# Patient Record
Sex: Male | Born: 1955 | Race: White | Hispanic: No | Marital: Single | Smoking: Former smoker
Health system: Southern US, Community
[De-identification: ages and names within clinical notes are randomized; demographics above are authoritative.]

## PROBLEM LIST (undated history)

## (undated) DIAGNOSIS — K5792 Diverticulitis of intestine, part unspecified, without perforation or abscess without bleeding: Secondary | ICD-10-CM

## (undated) DIAGNOSIS — D6800 Von Willebrand disease, unspecified: Secondary | ICD-10-CM

## (undated) DIAGNOSIS — C801 Malignant (primary) neoplasm, unspecified: Secondary | ICD-10-CM

## (undated) DIAGNOSIS — R011 Cardiac murmur, unspecified: Secondary | ICD-10-CM

## (undated) HISTORY — PX: EYE SURGERY: SHX253

## (undated) HISTORY — PX: NASAL FRACTURE SURGERY: SHX718

---

## 2012-12-17 ENCOUNTER — Inpatient Hospital Stay (HOSPITAL_COMMUNITY)
Admission: EM | Admit: 2012-12-17 | Discharge: 2012-12-20 | DRG: 416 | Disposition: A | Discharge status: Home / Self Care | Payer: BC Managed Care – PPO | Attending: Family Medicine | Admitting: Family Medicine

## 2012-12-17 ENCOUNTER — Emergency Department (HOSPITAL_COMMUNITY): Payer: BC Managed Care – PPO

## 2012-12-17 ENCOUNTER — Encounter (HOSPITAL_COMMUNITY): Payer: Self-pay | Admitting: *Deleted

## 2012-12-17 DIAGNOSIS — D72829 Elevated white blood cell count, unspecified: Secondary | ICD-10-CM

## 2012-12-17 DIAGNOSIS — N390 Urinary tract infection, site not specified: Secondary | ICD-10-CM | POA: Diagnosis present

## 2012-12-17 DIAGNOSIS — N50812 Left testicular pain: Secondary | ICD-10-CM | POA: Diagnosis present

## 2012-12-17 DIAGNOSIS — N400 Enlarged prostate without lower urinary tract symptoms: Secondary | ICD-10-CM | POA: Diagnosis present

## 2012-12-17 DIAGNOSIS — F1010 Alcohol abuse, uncomplicated: Secondary | ICD-10-CM | POA: Diagnosis present

## 2012-12-17 DIAGNOSIS — I959 Hypotension, unspecified: Secondary | ICD-10-CM

## 2012-12-17 DIAGNOSIS — Z833 Family history of diabetes mellitus: Secondary | ICD-10-CM

## 2012-12-17 DIAGNOSIS — R031 Nonspecific low blood-pressure reading: Secondary | ICD-10-CM | POA: Diagnosis present

## 2012-12-17 DIAGNOSIS — R509 Fever, unspecified: Secondary | ICD-10-CM

## 2012-12-17 DIAGNOSIS — A419 Sepsis, unspecified organism: Principal | ICD-10-CM

## 2012-12-17 DIAGNOSIS — D6800 Von Willebrand disease, unspecified: Secondary | ICD-10-CM

## 2012-12-17 DIAGNOSIS — Z8589 Personal history of malignant neoplasm of other organs and systems: Secondary | ICD-10-CM

## 2012-12-17 DIAGNOSIS — Z8249 Family history of ischemic heart disease and other diseases of the circulatory system: Secondary | ICD-10-CM

## 2012-12-17 DIAGNOSIS — F17200 Nicotine dependence, unspecified, uncomplicated: Secondary | ICD-10-CM | POA: Diagnosis present

## 2012-12-17 DIAGNOSIS — Z79899 Other long term (current) drug therapy: Secondary | ICD-10-CM

## 2012-12-17 DIAGNOSIS — R339 Retention of urine, unspecified: Secondary | ICD-10-CM | POA: Diagnosis present

## 2012-12-17 DIAGNOSIS — N509 Disorder of male genital organs, unspecified: Secondary | ICD-10-CM | POA: Diagnosis present

## 2012-12-17 DIAGNOSIS — F17210 Nicotine dependence, cigarettes, uncomplicated: Secondary | ICD-10-CM | POA: Diagnosis present

## 2012-12-17 HISTORY — DX: Von Willebrand disease, unspecified: D68.00

## 2012-12-17 HISTORY — DX: Cardiac murmur, unspecified: R01.1

## 2012-12-17 HISTORY — DX: Diverticulitis of intestine, part unspecified, without perforation or abscess without bleeding: K57.92

## 2012-12-17 HISTORY — DX: Malignant (primary) neoplasm, unspecified: C80.1

## 2012-12-17 HISTORY — DX: Von Willebrand's disease: D68.0

## 2012-12-17 LAB — CBC WITH DIFFERENTIAL/PLATELET
Basophils Relative: 0 % (ref 0–1)
Eosinophils Absolute: 0 10*3/uL (ref 0.0–0.7)
HCT: 43.3 % (ref 39.0–52.0)
Hemoglobin: 14.6 g/dL (ref 13.0–17.0)
Lymphs Abs: 1.2 10*3/uL (ref 0.7–4.0)
MCH: 33.5 pg (ref 26.0–34.0)
MCHC: 33.7 g/dL (ref 30.0–36.0)
Monocytes Absolute: 1.5 10*3/uL — ABNORMAL HIGH (ref 0.1–1.0)
Monocytes Relative: 8 % (ref 3–12)
Neutro Abs: 15.9 10*3/uL — ABNORMAL HIGH (ref 1.7–7.7)
RBC: 4.36 MIL/uL (ref 4.22–5.81)

## 2012-12-17 LAB — URINALYSIS, ROUTINE W REFLEX MICROSCOPIC
Bilirubin Urine: NEGATIVE
Glucose, UA: NEGATIVE mg/dL
Specific Gravity, Urine: 1.025 (ref 1.005–1.030)
pH: 5.5 (ref 5.0–8.0)

## 2012-12-17 LAB — BASIC METABOLIC PANEL
BUN: 8 mg/dL (ref 6–23)
CO2: 25 mEq/L (ref 19–32)
GFR calc non Af Amer: >90 mL/min (ref >90)
Glucose, Bld: 122 mg/dL — ABNORMAL HIGH (ref 70–99)
Potassium: 4 mEq/L (ref 3.5–5.1)
Sodium: 134 mEq/L — ABNORMAL LOW (ref 135–145)

## 2012-12-17 MED ORDER — SODIUM CHLORIDE 0.9 % IV SOLN: INTRAVENOUS | Status: DC

## 2012-12-17 MED ORDER — SODIUM CHLORIDE 0.9 % IV BOLUS (SEPSIS)
500.0000 mL | Freq: Once | INTRAVENOUS | Status: AC
  Administered 2012-12-17: 500 mL via INTRAVENOUS

## 2012-12-17 MED ORDER — DEXTROSE 5 % IV SOLN
1.0000 g | Freq: Once | INTRAVENOUS | Status: AC
  Administered 2012-12-17: 1 g via INTRAVENOUS
  Filled 2012-12-17: qty 10

## 2012-12-17 MED ORDER — ACETAMINOPHEN 500 MG PO TABS
1000.0000 mg | ORAL_TABLET | Freq: Once | ORAL | Status: AC
  Administered 2012-12-17: 1000 mg via ORAL
  Filled 2012-12-17: qty 2

## 2012-12-17 NOTE — ED Notes (Signed)
Attempted to call report. Was advised nurse receiving pt will call this nurse back for report. 

## 2012-12-17 NOTE — ED Notes (Signed)
Pt presents to er with c/o chills, dizziness, headache, lower back pain, increase in urination, urination in small amounts at times. Symptoms started yesterday.

## 2012-12-17 NOTE — H&P (Signed)
[facility] History and Physical  Oscar Sheppard  VOZ:[id number]  DOB: 09/14/1955   DOA: 12/17/2012   PCP:   No PCP Per Patient   Chief Complaint:  Fever chills and difficulty urinating since today  HPI: Oscar Sheppard is an 57 y.o. male.   Middle-aged Caucasian gentleman presents with the above symptoms, associated with a growing pain radiating down the left chest. For some time now he's noted a poor stream urinary frequency, hesitancy and urgent continence with her to suddenly become worse today. He notes that he had a previous episode of work sitting when he took some cough medicine. He has a remote history of "kidney infection" at age 70, but has not had his prostate checked since that time.   In the emergency room, he was lightheaded and hypotensive as well as febrile;  he had scrotal ultrasound and plain abdominal CT, which showed no evidence of epididymitis or kidney stones.  Denies dysuria or penile discharge. Smokes 3 cigars a day and has done so for at least 30 years Drinks 24 ounces a day or every evening, but will drink 4-6 cans on his days off per week, when he can afford. Denies alcohol withdrawal symptoms with abstinence  Colonoscopy in 2010 was normal He had extensive bleeding associated with the ENT surgeon; von Willebrand's disease was diagnosed.  Rewiew of Systems:   All systems negative except as marked bold or noted in the HPI;  Constitutional:    malaise, fever and chills. ;  Eyes:   eye pain, redness and discharge. ;  ENMT:   ear pain, hoarseness, nasal congestion, sinus pressure and sore throat. ;  Cardiovascular:    chest pain, palpitations, diaphoresis, dyspnea and peripheral edema.  Respiratory:   cough, hemoptysis, occasional wheezing and stridor. ;  Gastrointestinal:  nausea, vomiting, diarrhea, constipation, abdominal pain, melena, blood in stool, hematemesis, jaundice and rectal bleeding. unusual weight loss..    Musculoskeletal:   back pain and  neck pain.  swelling and trauma.;  Skin: .  pruritus, rash, abrasions, bruising and skin lesion.; ulcerations Neuro:    headache, lightheadedness and neck stiffness.  weakness, altered level of consciousness, altered mental status, extremity weakness, burning feet, involuntary movement, seizure and syncope.  Psych:    anxiety, depression, insomnia, tearfulness, panic attacks, hallucinations, paranoia, suicidal or homicidal ideation    Past Medical History  Diagnosis Date  . Diverticulitis   . Von Willebrand disease   . Heart murmur   . Cancer     Past Surgical History  Procedure Laterality Date  . Eye surgery    . Nasal fracture surgery      Medications:  HOME MEDS: Prior to Admission medications   Medication Sig Start Date End Date Taking? Authorizing Provider  aspirin EC 81 MG tablet Take 81 mg by mouth daily.   Yes Historical Provider, MD  fish oil-omega-3 fatty acids 1000 MG capsule Take 1-2 g by mouth 3 (three) times a week.   Yes Historical Provider, MD  Glucosamine 500 MG CAPS Take 1 capsule by mouth daily.   Yes Historical Provider, MD  neomycin-bacitracin-polymyxin (NEOSPORIN) [phone] ointment Apply 1 application topically daily as needed (for irritation).   Yes Historical Provider, MD  thiamine (VITAMIN B-1) 100 MG tablet Take 100 mg by mouth daily.   Yes Historical Provider, MD  tolnaftate (TING) 1 % cream Apply 1 application topically daily as needed (for irritation).   Yes Historical Provider, MD     Allergies:  Allergies  Allergen  Reactions  . Aspirin     Can take 81 mg dose, unable to take high dose due to medical hx.     Social History:   reports that he has quit smoking. He does not have any smokeless tobacco history on file. He reports that he does not drink alcohol or use illicit drugs.  Family History: Mother has diabetes and coronary disease;  Physical Exam: Filed Vitals:   12/17/12 1641 12/17/12 1955 12/17/12 2144  BP: 132/93 99/67 111/75   Pulse: 125 103 89  Temp: 98.1 F (36.7 C) 101.4 F (38.6 C) 99.4 F (37.4 C)  TempSrc: Oral Oral Oral  Resp: 20 20 18   SpO2: 98% 97% 97%   Blood pressure 111/75, pulse 89, temperature 99.4 F (37.4 C), temperature source Oral, resp. rate 18, SpO2 97.00%.  GEN:  Pleasant middle-aged Caucasian gentleman lying bed in no acute distress;  Marked temporalis muscle wasting; cooperative with exam PSYCH:  alert and oriented x4;  neither anxious nor depressed; affect is appropriate. HEENT: Mucous membranes pink and anicteric; PERRLA; EOM intact; no cervical lymphadenopathy nor thyromegaly or carotid bruit; no JVD; Breasts:: Not examined CHEST WALL: No tenderness CHEST: Normal respiration, clear to auscultation bilaterally HEART: Regular rate and rhythm; no murmurs rubs or gallops BACK: No kyphosis no scoliosis; no CVA tenderness ABDOMEN: , soft non-tender; no masses, no organomegaly, normal abdominal bowel sounds; no pannus; no intertriginous candida. Rectal Exam: Not done EXTREMITIES: ; age-appropriate arthropathy of the hands and knees; but was extensive muscle wasting; no edema; no ulcerations. Genitalia: not examined PULSES: 2+ and symmetric SKIN: Normal hydration no rash or ulceration CNS: Cranial nerves 2-12 grossly intact no focal lateralizing neurologic deficit   Labs on Admission:  Basic Metabolic Panel:  Recent Labs Lab 12/17/12 1837  NA 134*  K 4.0  CL 98  CO2 25  GLUCOSE 122*  BUN 8  CREATININE 0.73  CALCIUM 9.2   Liver Function Tests: No results found for this basename: AST, ALT, ALKPHOS, BILITOT, PROT, ALBUMIN,  in the last 168 hours No results found for this basename: LIPASE, AMYLASE,  in the last 168 hours No results found for this basename: AMMONIA,  in the last 168 hours CBC:  Recent Labs Lab 12/17/12 1837  WBC 18.7*  NEUTROABS 15.9*  HGB 14.6  HCT 43.3  MCV 99.3  PLT 173   Cardiac Enzymes: No results found for this basename: CKTOTAL, CKMB,  CKMBINDEX, TROPONINI,  in the last 168 hours BNP: No components found with this basename: POCBNP,  D-dimer: No components found with this basename: D-DIMER,  CBG: No results found for this basename: GLUCAP,  in the last 168 hours  Radiological Exams on Admission: Ct Abdomen Pelvis Wo Contrast  12/17/2012   *RADIOLOGY REPORT*  Clinical Data: Lower abdominal and pelvic pain.  Dysuria.  Back pain.  Chills.  CT ABDOMEN AND PELVIS WITHOUT CONTRAST  Technique:  Multidetector CT imaging of the abdomen and pelvis was performed following the standard protocol without intravenous contrast.  Comparison: None.  Findings: No evidence of renal calculi or hydronephrosis.  No evidence of ureteral calculi or dilatation.  No bladder calculi identified.  A small fluid attenuation left renal cyst noted. Urinary bladder and prostate are unremarkable in appearance.  The liver, gallbladder, pancreas, spleen, and adrenal glands have a normal appearance on this noncontrast study.  No soft tissue masses are identified.  No evidence of inflammatory process or abnormal fluid collections.  Diverticulosis is seen involving the descending and sigmoid  colon, however there is no evidence of diverticulitis. No evidence of dilated bowel loops or hernia.  IMPRESSION:  1.  No evidence of urolithiasis, hydronephrosis, or other acute findings. 2.  Diverticulosis.  No radiographic evidence of diverticulitis.   Original Report Authenticated By: Myles Rosenthal, M.D.   US Scrotum  12/17/2012   *RADIOLOGY REPORT*  Clinical Data:  Left-sided testicular pain.  SCROTAL ULTRASOUND DOPPLER ULTRASOUND OF THE TESTICLES  Technique: Complete ultrasound examination of the testicles, epididymis, and other scrotal structures was performed.  Color and spectral Doppler ultrasound were also utilized to evaluate blood flow to the testicles.  Comparison:  None  Findings:  Right testis:  4.5 x 2.9 x 4.0 cm.  No evidence of testicular mass or microlithiasis.  Blood  flow is seen within the right testicle on color Doppler ultrasound.  Left testis:  4.3 x 2.2 x 3.4 cm.  No evidence of testicular mass or microlithiasis.  Blood flow is seen within the left testicle on color Doppler ultrasound.  Right epididymis:  Difficult to visualize, but does not appear enlarged.  Left epididymis:  Difficult to visualize, but does not appear enlarged.  Hydrocele:  Moderate hydroceles are present bilaterally.  Varicocele:  A small to moderate right sided varicocele is seen along the epididymis.  A probable small left varicocele also noted.  Pulsed Doppler interrogation of both testes demonstrates low resistance flow bilaterally.  IMPRESSION:  1.  No evidence of testicular mass or torsion. 2.  Moderate bilateral hydroceles. 3.  Small to moderate bilateral varicoceles, right side larger than left.   Original Report Authenticated By: Myles Rosenthal, M.D.   [place] Art/ven Flow Abd Pelv Doppler  12/17/2012   *RADIOLOGY REPORT*  Clinical Data:  Left-sided testicular pain.  SCROTAL ULTRASOUND DOPPLER ULTRASOUND OF THE TESTICLES  Technique: Complete ultrasound examination of the testicles, epididymis, and other scrotal structures was performed.  Color and spectral Doppler ultrasound were also utilized to evaluate blood flow to the testicles.  Comparison:  None  Findings:  Right testis:  4.5 x 2.9 x 4.0 cm.  No evidence of testicular mass or microlithiasis.  Blood flow is seen within the right testicle on color Doppler ultrasound.  Left testis:  4.3 x 2.2 x 3.4 cm.  No evidence of testicular mass or microlithiasis.  Blood flow is seen within the left testicle on color Doppler ultrasound.  Right epididymis:  Difficult to visualize, but does not appear enlarged.  Left epididymis:  Difficult to visualize, but does not appear enlarged.  Hydrocele:  Moderate hydroceles are present bilaterally.  Varicocele:  A small to moderate right sided varicocele is seen along the epididymis.  A probable small left varicocele  also noted.  Pulsed Doppler interrogation of both testes demonstrates low resistance flow bilaterally.  IMPRESSION:  1.  No evidence of testicular mass or torsion. 2.  Moderate bilateral hydroceles. 3.  Small to moderate bilateral varicoceles, right side larger than left.   Original Report Authenticated By: Myles Rosenthal, M.D.     Assessment/Plan  Active Problems:   UTI (urinary tract infection)   BPH (benign prostatic hyperplasia)   Tobacco use disorder  transient hypotension  PLAN: We'll admit this gentleman for treatment of his urinary tract infection, and start Flomax Counsel on nicotine cessation Will likely need referral to urologist  Other plans as per orders.  Code Status: Full code Family Communication: Plans discussed with patient at bedside  Disposition Plan: Likely discharge to home when stable, and infection appears to be coming  under control    Chancey Cullinane Nocturnist [facility] Pager [phone]   12/17/2012, 9:57 PM

## 2012-12-17 NOTE — ED Notes (Signed)
Pt still in radiology at this time.

## 2012-12-17 NOTE — ED Provider Notes (Signed)
History     CSN: [id number]  Arrival date & time 12/17/12  1631   First MD Initiated Contact with Patient 12/17/12 1659      Chief Complaint  Patient presents with  . Urinary Retention  . Testicle Pain     HPI Pt was seen at 1715.  Per pt, c/o gradual onset and persistence of constant subjective fevers/chills, generalized body aches/fatigue, low back pain and urinary frequency for the past 2 days. States this morning he developed left testicular "pain" which radiates up into his lower abd. States the pain worsens with palpation and when he urinates.  States he has experienced these symptoms several years ago and was dx with "a kidney infection."  Denies rash, no penile drainage, no hematuria, no abd pain, no N/V/D, no CP/SOB, no cough, no sore throat.     Past Medical History  Diagnosis Date  . Diverticulitis   . Von Willebrand disease   . Heart murmur   . Cancer     Past Surgical History  Procedure Laterality Date  . Eye surgery    . Nasal fracture surgery      History  Substance Use Topics  . Smoking status: Former Games developer  . Smokeless tobacco: Not on file  . Alcohol Use: No      Review of Systems ROS: Statement: All systems negative except as marked or noted in the HPI; Constitutional: +chills, generalized body aches/fatigue. ; ; Eyes: Negative for eye pain, redness and discharge. ; ; ENMT: Negative for ear pain, hoarseness, nasal congestion, sinus pressure and sore throat. ; ; Cardiovascular: Negative for chest pain, palpitations, diaphoresis, dyspnea and peripheral edema. ; ; Respiratory: Negative for cough, wheezing and stridor. ; ; Gastrointestinal: Negative for nausea, vomiting, diarrhea, abdominal pain, blood in stool, hematemesis, jaundice and rectal bleeding. . ; ; Genitourinary: +urinary frequency. Negative for dysuria, flank pain and hematuria. ; ; Genital:  No penile drainage or rash, +left testicular pain, no swelling, no perineal pain, no scrotal rash or  swelling.;; Musculoskeletal: +LBP. Negative for neck pain. Negative for swelling and trauma.; ; Skin: Negative for pruritus, rash, abrasions, blisters, bruising and skin lesion.; ; Neuro: Negative for headache, lightheadedness and neck stiffness. Negative for weakness, altered level of consciousness , altered mental status, extremity weakness, paresthesias, involuntary movement, seizure and syncope.      Allergies  Aspirin  Home Medications   Current Outpatient Rx  Name  Route  Sig  Dispense  Refill  . aspirin EC 81 MG tablet   Oral   Take 81 mg by mouth daily.         . fish oil-omega-3 fatty acids 1000 MG capsule   Oral   Take 1-2 g by mouth 3 (three) times a week.         . Glucosamine 500 MG CAPS   Oral   Take 1 capsule by mouth daily.         Marland Kitchen neomycin-bacitracin-polymyxin (NEOSPORIN) [phone] ointment   Topical   Apply 1 application topically daily as needed (for irritation).         . thiamine (VITAMIN B-1) 100 MG tablet   Oral   Take 100 mg by mouth daily.         Marland Kitchen tolnaftate (TING) 1 % cream   Topical   Apply 1 application topically daily as needed (for irritation).           BP 132/93  Pulse 125  Temp(Src) 98.1 F (36.7 C) (Oral)  Resp 20  SpO2 98%  Physical Exam 1720: Physical examination:  Nursing notes reviewed; Vital signs and O2 SAT reviewed;  Constitutional: Well developed, Well nourished, Well hydrated, In no acute distress; Head:  Normocephalic, atraumatic; Eyes: EOMI, PERRL, No scleral icterus; ENMT: Mouth and pharynx normal, Mucous membranes moist; Neck: Supple, Full range of motion, No lymphadenopathy; Cardiovascular: Regular rate and rhythm, No murmur, rub, or gallop; Respiratory: Breath sounds clear & equal bilaterally, No rales, rhonchi, wheezes.  Speaking full sentences with ease, Normal respiratory effort/excursion; Chest: Nontender, Movement normal; Abdomen: Soft, Nontender, Nondistended, Normal bowel sounds; Spine:  No midline  CS, TS, LS tenderness. +TTP bilat lumbar paraspinal muscles;; Genitourinary: No CVA tenderness; Genital performed with pt permission and ED RN chaperone present during exam.  No perineal erythema, ecchymosis, soft tissue crepitus or pain.  No penile lesions or drainage.  No scrotal erythema, edema or tenderness to palp.  Normal testicular lie.  +mild left posterior-lateral testicular tenderness to palp.  +cremasteric reflexes bilat.  No inguinal LAN or palpable masses.;; Extremities: Pulses normal, No tenderness, No edema, No calf edema or asymmetry.; Neuro: AA&Ox3, Major CN grossly intact.  Speech clear. Climbs on and off stretcher easily by himself. Gait steady. No gross focal motor or sensory deficits in extremities.; Skin: Color normal, Warm, Dry.   ED Course  Procedures      MDM  MDM Reviewed: nursing note and vitals Interpretation: labs and CT scan   Results for orders placed during the hospital encounter of 12/17/12  URINALYSIS, ROUTINE W REFLEX MICROSCOPIC      Result Value Range   Color, Urine YELLOW  YELLOW   APPearance CLOUDY (*) CLEAR   Specific Gravity, Urine 1.025  1.005 - 1.030   pH 5.5  5.0 - 8.0   Glucose, UA NEGATIVE  NEGATIVE mg/dL   Hgb urine dipstick LARGE (*) NEGATIVE   Bilirubin Urine NEGATIVE  NEGATIVE   Ketones, ur NEGATIVE  NEGATIVE mg/dL   Protein, ur 30 (*) NEGATIVE mg/dL   Urobilinogen, UA 0.2  0.0 - 1.0 mg/dL   Nitrite POSITIVE (*) NEGATIVE   Leukocytes, UA MODERATE (*) NEGATIVE  URINE MICROSCOPIC-ADD ON      Result Value Range   WBC, UA 21-50  <3 WBC/hpf   RBC / HPF TOO NUMEROUS TO COUNT  <3 RBC/hpf   Bacteria, UA FEW (*) RARE  CBC WITH DIFFERENTIAL      Result Value Range   WBC 18.7 (*) 4.0 - 10.5 K/uL   RBC 4.36  4.22 - 5.81 MIL/uL   Hemoglobin 14.6  13.0 - 17.0 g/dL   HCT 16.1  09.6 - 04.5 %   MCV 99.3  78.0 - 100.0 fL   MCH 33.5  26.0 - 34.0 pg   MCHC 33.7  30.0 - 36.0 g/dL   RDW 40.9  81.1 - 91.4 %   Platelets 173  150 - 400 K/uL    Neutrophils Relative % 85 (*) 43 - 77 %   Neutro Abs 15.9 (*) 1.7 - 7.7 K/uL   Lymphocytes Relative 6 (*) 12 - 46 %   Lymphs Abs 1.2  0.7 - 4.0 K/uL   Monocytes Relative 8  3 - 12 %   Monocytes Absolute 1.5 (*) 0.1 - 1.0 K/uL   Eosinophils Relative 0  0 - 5 %   Eosinophils Absolute 0.0  0.0 - 0.7 K/uL   Basophils Relative 0  0 - 1 %   Basophils Absolute 0.1  0.0 - 0.1 K/uL  LACTIC ACID, PLASMA      Result Value Range   Lactic Acid, Venous 0.7  0.5 - 2.2 mmol/L  BASIC METABOLIC PANEL      Result Value Range   Sodium 134 (*) 135 - 145 mEq/L   Potassium 4.0  3.5 - 5.1 mEq/L   Chloride 98  96 - 112 mEq/L   CO2 25  19 - 32 mEq/L   Glucose, Bld 122 (*) 70 - 99 mg/dL   BUN 8  6 - 23 mg/dL   Creatinine, Ser 9.60  0.50 - 1.35 mg/dL   Calcium 9.2  8.4 - 45.4 mg/dL   GFR calc non Af Amer >90  >90 mL/min   GFR calc Af Amer >90  >90 mL/min   Ct Abdomen Pelvis Wo Contrast 12/17/2012   *RADIOLOGY REPORT*  Clinical Data: Lower abdominal and pelvic pain.  Dysuria.  Back pain.  Chills.  CT ABDOMEN AND PELVIS WITHOUT CONTRAST  Technique:  Multidetector CT imaging of the abdomen and pelvis was performed following the standard protocol without intravenous contrast.  Comparison: None.  Findings: No evidence of renal calculi or hydronephrosis.  No evidence of ureteral calculi or dilatation.  No bladder calculi identified.  A small fluid attenuation left renal cyst noted. Urinary bladder and prostate are unremarkable in appearance.  The liver, gallbladder, pancreas, spleen, and adrenal glands have a normal appearance on this noncontrast study.  No soft tissue masses are identified.  No evidence of inflammatory process or abnormal fluid collections.  Diverticulosis is seen involving the descending and sigmoid colon, however there is no evidence of diverticulitis. No evidence of dilated bowel loops or hernia.  IMPRESSION:  1.  No evidence of urolithiasis, hydronephrosis, or other acute findings. 2.  Diverticulosis.   No radiographic evidence of diverticulitis.   Original Report Authenticated By: Myles Rosenthal, M.D.   [place] Art/ven Flow Abd Pelv Doppler 12/17/2012   *RADIOLOGY REPORT*  Clinical Data:  Left-sided testicular pain.  SCROTAL ULTRASOUND DOPPLER ULTRASOUND OF THE TESTICLES  Technique: Complete ultrasound examination of the testicles, epididymis, and other scrotal structures was performed.  Color and spectral Doppler ultrasound were also utilized to evaluate blood flow to the testicles.  Comparison:  None  Findings:  Right testis:  4.5 x 2.9 x 4.0 cm.  No evidence of testicular mass or microlithiasis.  Blood flow is seen within the right testicle on color Doppler ultrasound.  Left testis:  4.3 x 2.2 x 3.4 cm.  No evidence of testicular mass or microlithiasis.  Blood flow is seen within the left testicle on color Doppler ultrasound.  Right epididymis:  Difficult to visualize, but does not appear enlarged.  Left epididymis:  Difficult to visualize, but does not appear enlarged.  Hydrocele:  Moderate hydroceles are present bilaterally.  Varicocele:  A small to moderate right sided varicocele is seen along the epididymis.  A probable small left varicocele also noted.  Pulsed Doppler interrogation of both testes demonstrates low resistance flow bilaterally.  IMPRESSION:  1.  No evidence of testicular mass or torsion. 2.  Moderate bilateral hydroceles. 3.  Small to moderate bilateral varicoceles, right side larger than left.   Original Report Authenticated By: Myles Rosenthal, M.D.     2115:  APAP given for fever.  SBP dropped to 90's, will give IVF bolus. +UTI, UC pending; will order IV rocephin. Dx and testing d/w pt.  Questions answered.  Verb understanding, agreeable to observation admit.  T/C to [facility]. Orvan Falconer, case discussed, including:  HPI, pertinent PM/SHx, VS/PE, dx testing, ED course and treatment:  Agreeable to observation admit, requests to write temporary orders, obtain inpatient tele bed to team  1.         Laray Anger, DO 12/20/12 Rickey Primus

## 2012-12-18 ENCOUNTER — Encounter (HOSPITAL_COMMUNITY): Payer: Self-pay | Admitting: Internal Medicine

## 2012-12-18 DIAGNOSIS — D680 Von Willebrand's disease: Secondary | ICD-10-CM

## 2012-12-18 DIAGNOSIS — A419 Sepsis, unspecified organism: Secondary | ICD-10-CM

## 2012-12-18 DIAGNOSIS — N390 Urinary tract infection, site not specified: Secondary | ICD-10-CM | POA: Diagnosis present

## 2012-12-18 DIAGNOSIS — N509 Disorder of male genital organs, unspecified: Secondary | ICD-10-CM

## 2012-12-18 DIAGNOSIS — F17200 Nicotine dependence, unspecified, uncomplicated: Secondary | ICD-10-CM | POA: Diagnosis present

## 2012-12-18 DIAGNOSIS — N400 Enlarged prostate without lower urinary tract symptoms: Secondary | ICD-10-CM | POA: Diagnosis present

## 2012-12-18 DIAGNOSIS — F17210 Nicotine dependence, cigarettes, uncomplicated: Secondary | ICD-10-CM | POA: Diagnosis present

## 2012-12-18 DIAGNOSIS — N50812 Left testicular pain: Secondary | ICD-10-CM | POA: Diagnosis present

## 2012-12-18 DIAGNOSIS — F1010 Alcohol abuse, uncomplicated: Secondary | ICD-10-CM | POA: Diagnosis present

## 2012-12-18 LAB — BASIC METABOLIC PANEL
Calcium: 8.8 mg/dL (ref 8.4–10.5)
GFR calc Af Amer: >90 mL/min (ref >90)
GFR calc non Af Amer: >90 mL/min (ref >90)
Glucose, Bld: 115 mg/dL — ABNORMAL HIGH (ref 70–99)
Sodium: 134 mEq/L — ABNORMAL LOW (ref 135–145)

## 2012-12-18 LAB — HEPATIC FUNCTION PANEL
ALT: 14 U/L (ref 0–53)
AST: 15 U/L (ref 0–37)
Bilirubin, Direct: 0.2 mg/dL (ref 0.0–0.3)
Total Bilirubin: 1.2 mg/dL (ref 0.3–1.2)

## 2012-12-18 LAB — CBC
MCH: 33.3 pg (ref 26.0–34.0)
Platelets: 164 10*3/uL (ref 150–400)
RBC: 4.2 MIL/uL — ABNORMAL LOW (ref 4.22–5.81)
WBC: 15 10*3/uL — ABNORMAL HIGH (ref 4.0–10.5)

## 2012-12-18 LAB — PROTIME-INR: Prothrombin Time: 14.8 seconds (ref 11.6–15.2)

## 2012-12-18 LAB — TSH: TSH: 2.412 u[IU]/mL (ref 0.350–4.500)

## 2012-12-18 MED ORDER — LORAZEPAM 2 MG/ML IJ SOLN: 1.0000 mg | Freq: Four times a day (QID) | INTRAMUSCULAR | Status: DC | PRN

## 2012-12-18 MED ORDER — VITAMIN B-1 100 MG PO TABS
100.0000 mg | ORAL_TABLET | Freq: Every day | ORAL | Status: DC
  Administered 2012-12-18 – 2012-12-20 (×3): 100 mg via ORAL
  Filled 2012-12-18 (×3): qty 1

## 2012-12-18 MED ORDER — ONDANSETRON HCL 4 MG PO TABS: 4.0000 mg | ORAL_TABLET | Freq: Four times a day (QID) | ORAL | Status: DC | PRN

## 2012-12-18 MED ORDER — PNEUMOCOCCAL VAC POLYVALENT 25 MCG/0.5ML IJ INJ
0.5000 mL | INJECTION | INTRAMUSCULAR | Status: AC
  Administered 2012-12-19: 0.5 mL via INTRAMUSCULAR
  Filled 2012-12-18: qty 0.5

## 2012-12-18 MED ORDER — OMEGA-3-ACID ETHYL ESTERS 1 G PO CAPS
1.0000 g | ORAL_CAPSULE | Freq: Every day | ORAL | Status: DC
  Administered 2012-12-18 – 2012-12-20 (×3): 1 g via ORAL
  Filled 2012-12-18 (×3): qty 1

## 2012-12-18 MED ORDER — CIPROFLOXACIN IN D5W 400 MG/200ML IV SOLN
400.0000 mg | Freq: Two times a day (BID) | INTRAVENOUS | Status: DC
  Administered 2012-12-18: 400 mg via INTRAVENOUS
  Filled 2012-12-18 (×4): qty 200

## 2012-12-18 MED ORDER — ACETAMINOPHEN 325 MG PO TABS
650.0000 mg | ORAL_TABLET | Freq: Four times a day (QID) | ORAL | Status: DC | PRN
  Administered 2012-12-18 – 2012-12-19 (×2): 650 mg via ORAL
  Filled 2012-12-18 (×2): qty 2

## 2012-12-18 MED ORDER — DOXYCYCLINE HYCLATE 100 MG PO TABS
100.0000 mg | ORAL_TABLET | Freq: Two times a day (BID) | ORAL | Status: DC
  Administered 2012-12-18 – 2012-12-20 (×5): 100 mg via ORAL
  Filled 2012-12-18 (×5): qty 1

## 2012-12-18 MED ORDER — LORAZEPAM 1 MG PO TABS: 1.0000 mg | ORAL_TABLET | Freq: Four times a day (QID) | ORAL | Status: DC | PRN

## 2012-12-18 MED ORDER — BISACODYL 10 MG RE SUPP: 10.0000 mg | Freq: Every day | RECTAL | Status: DC | PRN

## 2012-12-18 MED ORDER — TRAZODONE HCL 50 MG PO TABS: 50.0000 mg | ORAL_TABLET | Freq: Every evening | ORAL | Status: DC | PRN

## 2012-12-18 MED ORDER — NICOTINE 14 MG/24HR TD PT24
14.0000 mg | MEDICATED_PATCH | Freq: Every day | TRANSDERMAL | Status: DC
  Administered 2012-12-18 – 2012-12-20 (×4): 14 mg via TRANSDERMAL
  Filled 2012-12-18 (×3): qty 1

## 2012-12-18 MED ORDER — FLEET ENEMA 7-19 GM/118ML RE ENEM: 1.0000 | ENEMA | Freq: Once | RECTAL | Status: AC | PRN

## 2012-12-18 MED ORDER — POTASSIUM CHLORIDE IN NACL 20-0.9 MEQ/L-% IV SOLN
INTRAVENOUS | Status: DC
  Administered 2012-12-18 (×2): via INTRAVENOUS

## 2012-12-18 MED ORDER — ENOXAPARIN SODIUM 40 MG/0.4ML ~~LOC~~ SOLN
40.0000 mg | SUBCUTANEOUS | Status: DC
  Administered 2012-12-18: 40 mg via SUBCUTANEOUS
  Filled 2012-12-18: qty 0.4

## 2012-12-18 MED ORDER — OMEGA-3 FATTY ACIDS 1000 MG PO CAPS
1.0000 g | ORAL_CAPSULE | ORAL | Status: DC
  Filled 2012-12-18: qty 2

## 2012-12-18 MED ORDER — ONDANSETRON HCL 4 MG/2ML IJ SOLN: 4.0000 mg | INTRAMUSCULAR | Status: DC | PRN

## 2012-12-18 MED ORDER — CIPROFLOXACIN IN D5W 400 MG/200ML IV SOLN
INTRAVENOUS | Status: AC
  Filled 2012-12-18: qty 200

## 2012-12-18 MED ORDER — ADULT MULTIVITAMIN W/MINERALS CH
1.0000 | ORAL_TABLET | Freq: Every day | ORAL | Status: DC
  Administered 2012-12-18 – 2012-12-20 (×3): 1 via ORAL
  Filled 2012-12-18 (×3): qty 1

## 2012-12-18 MED ORDER — ENOXAPARIN SODIUM 40 MG/0.4ML ~~LOC~~ SOLN
40.0000 mg | Freq: Every day | SUBCUTANEOUS | Status: DC
  Administered 2012-12-18 – 2012-12-19 (×2): 40 mg via SUBCUTANEOUS
  Filled 2012-12-18 (×2): qty 0.4

## 2012-12-18 MED ORDER — ASPIRIN EC 81 MG PO TBEC
81.0000 mg | DELAYED_RELEASE_TABLET | Freq: Every day | ORAL | Status: DC
  Administered 2012-12-18 – 2012-12-20 (×3): 81 mg via ORAL
  Filled 2012-12-18 (×3): qty 1

## 2012-12-18 MED ORDER — DEXTROSE 5 % IV SOLN
1.0000 g | INTRAVENOUS | Status: DC
  Administered 2012-12-18 – 2012-12-19 (×2): 1 g via INTRAVENOUS
  Filled 2012-12-18 (×3): qty 10

## 2012-12-18 MED ORDER — FOLIC ACID 1 MG PO TABS
1.0000 mg | ORAL_TABLET | Freq: Every day | ORAL | Status: DC
  Administered 2012-12-18 – 2012-12-20 (×3): 1 mg via ORAL
  Filled 2012-12-18 (×3): qty 1

## 2012-12-18 MED ORDER — TAMSULOSIN HCL 0.4 MG PO CAPS
0.4000 mg | ORAL_CAPSULE | Freq: Every day | ORAL | Status: DC
  Administered 2012-12-18 – 2012-12-19 (×2): 0.4 mg via ORAL
  Filled 2012-12-18 (×3): qty 1

## 2012-12-18 NOTE — Progress Notes (Signed)
[facility] PROGRESS NOTE  Oscar Sheppard ZOX:[id number] DOB: 08/13/1955 DOA: 12/17/2012 PCP: No PCP Per Patient  Assessment/Plan: 1. UTI with possible sepsis on admission: Followup culture. Continue IV fluids and antibiotics. 2. Left testicular pain: No evidence of torsion, mass or acute surgical process. Consider epididymitis although imaging was unremarkable. Elevate scrotum. Add doxycycline. 3. Suspected BPH: Trial of Flomax. Consider outpatient urology evaluation. 4. Alcohol abuse: CIWA. Monitor for withdrawal. 5. History of von Willebrand's disease 6. Cigarette smoker: Nicotine patch offered. Recommend cessation.   Discontinue Cipro. Add doxycycline for 10 days  Discontinue telemetry.  Code Status: Full code DVT prophylaxis: Lovenox Family Communication: None present Disposition Plan: Home when improved  Brendia Sacks, MD  [facility]  Pager [phone] If 7PM-7AM, please contact night-coverage at [email or web address], password [facility] 12/18/2012, 10:38 AM  LOS: 1 day   Clinical Summary: 57 year old man presented with low back pain, fever, chills, urinary hesitation and increased urination and left testicular pain in the emergency department he was noted be febrile and hypotensive. Scrotal ultrasound and abdominal CT were unremarkable. Denied penile discharge.  Consultants:    Procedures:    Antibiotics:  Ceftriaxone 5/31 >>  Ciprofloxacin 6/1 >>  HPI/Subjective: Tmax 101.4. Overall feels somewhat better. No vomiting. Less testicular pain today. Urinating frequently.   Objective: Filed Vitals:   12/17/12 2144 12/17/12 2231 12/18/12 0208 12/18/12 0555  BP: 111/75 126/80 109/66 107/68  Pulse: 89 101 105 109  Temp: 99.4 F (37.4 C) 98.3 F (36.8 C)  99 F (37.2 C)  TempSrc: Oral Oral  Oral  Resp: 18 20 18 20   Height:  6\' 4"  (1.93 m)    Weight:  93.305 kg (205 lb 11.2 oz)  93.622 kg (206 lb 6.4 oz)  SpO2: 97% 100% 98% 97%    Intake/Output Summary (Last  24 hours) at 12/18/12 1038 Last data filed at 12/18/12 0800  Gross per 24 hour  Intake    740 ml  Output    950 ml  Net   -210 ml     Filed Weights   12/17/12 2231 12/18/12 0555  Weight: 93.305 kg (205 lb 11.2 oz) 93.622 kg (206 lb 6.4 oz)    Exam:  General:  Appears calm and comfortable. Nontoxic. Cardiovascular: RRR, no m/r/g. No LE edema. Telemetry: SR, no arrhythmias  Respiratory: CTA bilaterally, no w/r/r. Normal respiratory effort. Abdomen: soft, ntnd GU: Penis uncircumcised, appears unremarkable. Scrotal appears normal. Right testicle normal palpation without tenderness. Left testicle tender to palpation, no masses appreciated. Hydrocele palpated. Psychiatric: grossly normal mood and affect, speech fluent and appropriate Neurologic: grossly non-focal.  Data Reviewed:  Urinalysis grossly positive. Complete metabolic panel unremarkable. White blood cell count decreased to 15.0. Ultrasound of scrotum unremarkable except for bilateral hydroceles and varicoceles. CT of the abdomen and pelvis unremarkable. No evidence of urolithiasis, hydronephrosis.  Pending studies:  Urine culture   Scheduled Meds: . aspirin EC  81 mg Oral Daily  . cefTRIAXone (ROCEPHIN)  IV  1 g Intravenous Q24H  . ciprofloxacin  400 mg Intravenous Q12H  . enoxaparin (LOVENOX) injection  40 mg Subcutaneous Q24H  . omega-3 acid ethyl esters  1 g Oral Daily  . tamsulosin  0.4 mg Oral QPC supper  . thiamine  100 mg Oral Daily   Continuous Infusions: . 0.9 % NaCl with KCl 20 mEq / L 150 mL/hr at 12/18/12 1016    Principal Problem:   UTI (urinary tract infection) Active Problems:   BPH (benign prostatic hyperplasia)  Tobacco use disorder   Testicular pain, left   Sepsis   Alcohol abuse   Von Willebrand disease   Cigarette smoker   Time spent 25 minutes

## 2012-12-18 NOTE — Consult Note (Signed)
FAOZ#[id number]

## 2012-12-19 DIAGNOSIS — R339 Retention of urine, unspecified: Secondary | ICD-10-CM | POA: Diagnosis present

## 2012-12-19 LAB — GLUCOSE, CAPILLARY: Glucose-Capillary: 157 mg/dL — ABNORMAL HIGH (ref 70–99)

## 2012-12-19 LAB — CBC
HCT: 40.8 % (ref 39.0–52.0)
Hemoglobin: 13.7 g/dL (ref 13.0–17.0)
RDW: 13.6 % (ref 11.5–15.5)
WBC: 9.8 10*3/uL (ref 4.0–10.5)

## 2012-12-19 MED ORDER — IBUPROFEN 600 MG PO TABS
600.0000 mg | ORAL_TABLET | Freq: Four times a day (QID) | ORAL | Status: DC | PRN
  Administered 2012-12-19: 600 mg via ORAL
  Filled 2012-12-19: qty 1

## 2012-12-19 NOTE — Progress Notes (Signed)
[facility]  Lieutenant Abarca QMV:[id number] DOB: 04-15-1956 DOA: 12/17/2012 PCP: No PCP Per Patient  Patient seen, independently examined and chart reviewed. I agree with exam, assessment and plan discussed with Toya Smothers, NP.  Interval history/Subjective: Feels a little better. Had chills this morning. Urinary retention overnight. Foley catheter was placed with return. Urology was consulted.  Objective: Appears calm and comfortable. Vitals stable. Cardiovascular regular rate and rhythm. Respiratory clear. Abdomen soft. No hernia appreciated.  Labs/studies: CBC unremarkable  Assessment:  UTI with possible sepsis on admission  Left testicular pain, apparently improving  Plan:  Continue antibiotics, followup culture  Brendia Sacks, MD [facility] [phone]

## 2012-12-19 NOTE — Consult Note (Signed)
[name], [name]              ACCOUNT NO.:  [id number]  MEDICAL RECORD NO.:  [id number]  LOCATION:  A324                          FACILITY:  [facility]  PHYSICIAN:  Ky Barban, M.D.DATE OF BIRTH:  [birth year]  DATE OF CONSULTATION: DATE OF DISCHARGE:                                CONSULTATION   CHIEF COMPLAINT:  Fever and chills, difficulty to void.  HISTORY:  A 57 year old gentleman came to the emergency room because he was not feeling well, having chills and fever, having urinary frequency, urgency, urge incontinence, almost no nocturia.  He says urinary stream is slow, has difficulty to empty his bladder, and he was placed on __________ after having a CT scan of the abdomen and pelvis, which was negative.  No urinary stone.  He was admitted for further workup and management.  He was complaining of pain in the left testicle, so testicular ultrasound was done, it looks essentially normal.  He is still having some discomfort, he is still having chills.  Since yesterday, he has been placed on IV Rocephin along with Vibra-Tabs 100 mg daily.  His urine culture is still pending.  His temperature is 99.1. He came with 101.4 temperature.  I have reviewed his old record and all his chart.  Reviewed his x-rays.  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  I just do not understand why he had, but that is how he described it.  PHYSICAL EXAMINATION:  GENERAL:  Moderately built, fully conscious, alert, oriented, not in acute distress. VITAL SIGNS:  His blood pressure is 127/82, temperature is 98.5, pulse 101 per minute, respirations 20 per minute, O2 saturation room air was 99%. ABDOMEN:  Soft, flat.  Liver, spleen, kidneys are not palpable.  No CVA tenderness.  His bladder scan for residual has been done  several times. Nurse told me it was about 300 mL, so we put a Foley catheter.  We got about 400 mL of residual urine. RECTAL:  I purposely deferred it because he is having tenderness on the left side, I do not want to cause any epididymitis.  I will see him again in the morning and see how is he doing and then decide about doing further workup with cystoscopy.  I appreciate Dr. Orvan Falconer for this consultation.     Ky Barban, M.D.    MIJ/MEDQ  D:  12/18/2012  T:  12/18/2012  Job:  [id number]

## 2012-12-19 NOTE — Progress Notes (Signed)
[facility] PROGRESS NOTE  Oscar Sheppard XBJ:[id number] DOB: 1955-08-15 DOA: 12/17/2012 PCP: No PCP Per Patient  Assessment/Plan: 1. UTI with possible sepsis on admission: Culture pending. Max temp 100.5. Hemodynamically stable. Taking po fluids well. Continue  Antibiotics day #3. 2. Left testicular pain: some improvement today. No evidence of torsion, mass or acute surgical process. Consider epididymitis although imaging was unremarkable. Continue to elevate scrotum. Continue doxycycline day #2. 3. Suspected BPH: Appreciate urology assistance. Await recommendations.  Foley intact draining clear yellow urine.  4. Alcohol abuse: CIWA. Monitor for withdrawal. 5. History of von Willebrand's disease 6. Cigarette smoker: Nicotine patch offered. Recommend cessation. 7. [name] retention: etiology unclear. Foley intact draining. Await urology recommendations.    Code Status: full Family Communication: none availabl Disposition Plan: home when ready   Consultants:  Urology  Procedures:  none  Antibiotics:  cipro 5/31/-12/18/12  Doxycycline 12/18/12>>> 12/28/12  rocephine 12/17/12 >>>  HPI/Subjective: Awake alert denies pain/discomfort. Reports not sleeping well  Objective: Filed Vitals:   12/18/12 1506 12/18/12 1830 12/18/12 2300 12/19/12 0500  BP: 124/70 127/82 122/80 108/71  Pulse: 110 101 102 85  Temp: 99.1 F (37.3 C) 98.5 F (36.9 C) 100.5 F (38.1 C) 99.3 F (37.4 C)  TempSrc: Oral Oral Oral Oral  Resp: 20 20 20 14   Height:      Weight:      SpO2: 98% 99% 98% 96%    Intake/Output Summary (Last 24 hours) at 12/19/12 1035 Last data filed at 12/19/12 0932  Gross per 24 hour  Intake    480 ml  Output   2150 ml  Net  -1670 ml   Filed Weights   12/17/12 2231 12/18/12 0555  Weight: 93.305 kg (205 lb 11.2 oz) 93.622 kg (206 lb 6.4 oz)    Exam:   General:  Well nourished NAD  Cardiovascular: RRR no MGR no LE edema  Respiratory: normal effort BS clear  bilaterally no wheeze, no rhonchi  Abdomen: round soft +BS non-tender to palpation  Musculoskeletal: no clubbing no cyanosis  GU: Uncircumcised penis without drainage, foley intact, scrotum without erythemal swelling   Data Reviewed: Basic Metabolic Panel:  Recent Labs Lab 12/17/12 1837 12/18/12 0630  NA 134* 134*  K 4.0 3.8  CL 98 101  CO2 25 24  GLUCOSE 122* 115*  BUN 8 8  CREATININE 0.73 0.62  CALCIUM 9.2 8.8   Liver Function Tests:  Recent Labs Lab 12/18/12 0630  AST 15  ALT 14  ALKPHOS 97  BILITOT 1.2  PROT 6.8  ALBUMIN 3.4*   No results found for this basename: LIPASE, AMYLASE,  in the last 168 hours No results found for this basename: AMMONIA,  in the last 168 hours CBC:  Recent Labs Lab 12/17/12 1837 12/18/12 0630 12/19/12 0506  WBC 18.7* 15.0* 9.8  NEUTROABS 15.9*  --   --   HGB 14.6 14.0 13.7  HCT 43.3 41.8 40.8  MCV 99.3 99.5 98.3  PLT 173 164 168   Cardiac Enzymes: No results found for this basename: CKTOTAL, CKMB, CKMBINDEX, TROPONINI,  in the last 168 hours BNP (last 3 results) No results found for this basename: PROBNP,  in the last 8760 hours CBG: No results found for this basename: GLUCAP,  in the last 168 hours  No results found for this or any previous visit (from the past 240 hour(s)).   Studies: Ct Abdomen Pelvis Wo Contrast  12/17/2012   *RADIOLOGY REPORT*  Clinical Data: Lower abdominal and pelvic pain.  Dysuria.  Back pain.  Chills.  CT ABDOMEN AND PELVIS WITHOUT CONTRAST  Technique:  Multidetector CT imaging of the abdomen and pelvis was performed following the standard protocol without intravenous contrast.  Comparison: None.  Findings: No evidence of renal calculi or hydronephrosis.  No evidence of ureteral calculi or dilatation.  No bladder calculi identified.  A small fluid attenuation left renal cyst noted. Urinary bladder and prostate are unremarkable in appearance.  The liver, gallbladder, pancreas, spleen, and adrenal  glands have a normal appearance on this noncontrast study.  No soft tissue masses are identified.  No evidence of inflammatory process or abnormal fluid collections.  Diverticulosis is seen involving the descending and sigmoid colon, however there is no evidence of diverticulitis. No evidence of dilated bowel loops or hernia.  IMPRESSION:  1.  No evidence of urolithiasis, hydronephrosis, or other acute findings. 2.  Diverticulosis.  No radiographic evidence of diverticulitis.   Original Report Authenticated By: Myles Rosenthal, M.D.   US Scrotum  12/17/2012   *RADIOLOGY REPORT*  Clinical Data:  Left-sided testicular pain.  SCROTAL ULTRASOUND DOPPLER ULTRASOUND OF THE TESTICLES  Technique: Complete ultrasound examination of the testicles, epididymis, and other scrotal structures was performed.  Color and spectral Doppler ultrasound were also utilized to evaluate blood flow to the testicles.  Comparison:  None  Findings:  Right testis:  4.5 x 2.9 x 4.0 cm.  No evidence of testicular mass or microlithiasis.  Blood flow is seen within the right testicle on color Doppler ultrasound.  Left testis:  4.3 x 2.2 x 3.4 cm.  No evidence of testicular mass or microlithiasis.  Blood flow is seen within the left testicle on color Doppler ultrasound.  Right epididymis:  Difficult to visualize, but does not appear enlarged.  Left epididymis:  Difficult to visualize, but does not appear enlarged.  Hydrocele:  Moderate hydroceles are present bilaterally.  Varicocele:  A small to moderate right sided varicocele is seen along the epididymis.  A probable small left varicocele also noted.  Pulsed Doppler interrogation of both testes demonstrates low resistance flow bilaterally.  IMPRESSION:  1.  No evidence of testicular mass or torsion. 2.  Moderate bilateral hydroceles. 3.  Small to moderate bilateral varicoceles, right side larger than left.   Original Report Authenticated By: Myles Rosenthal, M.D.   [place] Art/ven Flow Abd Pelv  Doppler  12/17/2012   *RADIOLOGY REPORT*  Clinical Data:  Left-sided testicular pain.  SCROTAL ULTRASOUND DOPPLER ULTRASOUND OF THE TESTICLES  Technique: Complete ultrasound examination of the testicles, epididymis, and other scrotal structures was performed.  Color and spectral Doppler ultrasound were also utilized to evaluate blood flow to the testicles.  Comparison:  None  Findings:  Right testis:  4.5 x 2.9 x 4.0 cm.  No evidence of testicular mass or microlithiasis.  Blood flow is seen within the right testicle on color Doppler ultrasound.  Left testis:  4.3 x 2.2 x 3.4 cm.  No evidence of testicular mass or microlithiasis.  Blood flow is seen within the left testicle on color Doppler ultrasound.  Right epididymis:  Difficult to visualize, but does not appear enlarged.  Left epididymis:  Difficult to visualize, but does not appear enlarged.  Hydrocele:  Moderate hydroceles are present bilaterally.  Varicocele:  A small to moderate right sided varicocele is seen along the epididymis.  A probable small left varicocele also noted.  Pulsed Doppler interrogation of both testes demonstrates low resistance flow bilaterally.  IMPRESSION:  1.  No evidence of  testicular mass or torsion. 2.  Moderate bilateral hydroceles. 3.  Small to moderate bilateral varicoceles, right side larger than left.   Original Report Authenticated By: Myles Rosenthal, M.D.    Scheduled Meds: . aspirin EC  81 mg Oral Daily  . cefTRIAXone (ROCEPHIN)  IV  1 g Intravenous Q24H  . doxycycline  100 mg Oral Q12H  . enoxaparin (LOVENOX) injection  40 mg Subcutaneous Q2000  . folic acid  1 mg Oral Daily  . multivitamin with minerals  1 tablet Oral Daily  . nicotine  14 mg Transdermal Daily  . omega-3 acid ethyl esters  1 g Oral Daily  . tamsulosin  0.4 mg Oral QPC supper  . thiamine  100 mg Oral Daily   Continuous Infusions:   Principal Problem:   UTI (urinary tract infection) Active Problems:   BPH (benign prostatic hyperplasia)    Tobacco use disorder   Testicular pain, left   Sepsis   Alcohol abuse   Von Willebrand disease   Cigarette smoker    Time spent:     [facility]  [facility] Pager [phone]. If 7PM-7AM, please contact night-coverage at [email or web address], password [facility] 12/19/2012, 10:35 AM  LOS: 2 days

## 2012-12-19 NOTE — Progress Notes (Signed)
UR chart review completed.  

## 2012-12-19 NOTE — Care Management Note (Signed)
    Page 1 of 1   12/20/2012     2:15:57 PM   CARE MANAGEMENT NOTE 12/20/2012  Patient:  Oscar Sheppard, Oscar Sheppard   Account Number:  [id number]  Date Initiated:  12/19/2012  Documentation initiated by:  Sharrie Rothman  Subjective/Objective Assessment:   Pt admitted from home with UTI. Pt lives alone on his parents property. Pt is independent with ADL's. Pt may discharge home with foley.     Action/Plan:   Pt may need HH RN at discharge short term if d/c'd with foley. Will continue to monitor.   Anticipated DC Date:  12/21/2012   Anticipated DC Plan:  HOME/SELF CARE      DC Planning Services  CM consult      Choice offered to / List presented to:             Status of service:  Completed, signed off Medicare Important Message given?   (If response is "NO", the following Medicare IM given date fields will be blank) Date Medicare IM given:   Date Additional Medicare IM given:    Discharge Disposition:  HOME/SELF CARE  Per UR Regulation:    If discussed at Long Length of Stay Meetings, dates discussed:    Comments:  12/19/12 1323 Arlyss Queen, RN BSN CM 12/20/12 1400 Dustee Bottenfield Leanord Hawking RN BSN CM Pt declines HH RN since he will be following up with Dr, Jerre Simon on Thursday. DC with foley.

## 2012-12-20 DIAGNOSIS — D680 Von Willebrand's disease: Secondary | ICD-10-CM

## 2012-12-20 LAB — CBC
MCH: 33.2 pg (ref 26.0–34.0)
MCHC: 34.8 g/dL (ref 30.0–36.0)
Platelets: 180 10*3/uL (ref 150–400)
RBC: 4.34 MIL/uL (ref 4.22–5.81)
RDW: 13.5 % (ref 11.5–15.5)

## 2012-12-20 LAB — URINE CULTURE

## 2012-12-20 LAB — BASIC METABOLIC PANEL
Calcium: 9 mg/dL (ref 8.4–10.5)
GFR calc non Af Amer: >90 mL/min (ref >90)
Sodium: 133 mEq/L — ABNORMAL LOW (ref 135–145)

## 2012-12-20 MED ORDER — FOLIC ACID 1 MG PO TABS: 1.0000 mg | ORAL_TABLET | Freq: Every day | ORAL | Status: DC

## 2012-12-20 MED ORDER — DOXYCYCLINE HYCLATE 100 MG PO TABS: 100.0000 mg | ORAL_TABLET | Freq: Two times a day (BID) | ORAL | Status: AC

## 2012-12-20 MED ORDER — BISACODYL 10 MG RE SUPP: 10.0000 mg | Freq: Every day | RECTAL | Status: DC | PRN

## 2012-12-20 MED ORDER — TAMSULOSIN HCL 0.4 MG PO CAPS: 0.4000 mg | ORAL_CAPSULE | Freq: Every day | ORAL | Status: AC

## 2012-12-20 MED ORDER — NICOTINE 14 MG/24HR TD PT24: 1.0000 | MEDICATED_PATCH | Freq: Every day | TRANSDERMAL | Status: DC

## 2012-12-20 MED ORDER — ADULT MULTIVITAMIN W/MINERALS CH: 1.0000 | ORAL_TABLET | Freq: Every day | ORAL | Status: AC

## 2012-12-20 MED ORDER — SULFAMETHOXAZOLE-TRIMETHOPRIM 800-160 MG PO TABS: 1.0000 | ORAL_TABLET | Freq: Two times a day (BID) | ORAL | Status: DC

## 2012-12-20 NOTE — Discharge Summary (Signed)
Physician Discharge Summary  Oscar Sheppard ZOX:[id number] DOB: August 22, 1955 DOA: 12/17/2012  PCP: No PCP Per Patient  Admit date: 12/17/2012 Discharge date: 12/20/2012  Time spent: 40 minutes  Recommendations for Outpatient Follow-up:  1. Follow up with Dr. Jerre Simon 12/22/12 for evaluation and further workup. Will be discharged with foley.   Discharge Diagnoses:  Principal Problem:   UTI (urinary tract infection) Active Problems:   BPH (benign prostatic hyperplasia)   Tobacco use disorder   Testicular pain, left   Sepsis   Alcohol abuse   Von Willebrand disease   Cigarette smoker   Urinary retention   Discharge Condition: stable  Diet recommendation: heart healthy  Filed Weights   12/17/12 2231 12/18/12 0555  Weight: 93.305 kg (205 lb 11.2 oz) 93.622 kg (206 lb 6.4 oz)    History of present illness:  Oscar Sheppard is an 57 y.o.Caucasian gentleman presented to ED on 12/17/12 with fever, chills and difficulty urinating  associated with a growing pain radiating down the left chest. For some time now he's noted a poor stream urinary frequency, hesitancy and urgent continence suddenly suddenly became worse on day of admission. He noted he had a remote history of "kidney infection" at age 53, but has not had his prostate checked since that time. In the emergency room, he was lightheaded and hypotensive as well as febrile; he had scrotal ultrasound and plain abdominal CT, which showed no evidence of epididymitis or kidney stones.  Denied dysuria or penile discharge. Smokes 3 cigars a day and has done so for at least 30 years. Drinks 24 ounces a day or every evening, but will drink 4-6 cans on his days off per week, when he can afford. Denied alcohol withdrawal symptoms with abstinence Colonoscopy in 2010 was normal  He had extensive bleeding associated with the ENT surgeon; von Willebrand's disease was diagnosed.      Hospital Course:  UTI with possible sepsis on admission: Urine culture +E  coli sensitive to Rocephin and  Bactrim DS. Pt received IV rocephin for 3 days. Will continue at discharge with Bactrim DS for 10 more days.  Max temp 101.9 the evening before discharge. Pt remained hemodynamically stable. On day of discharge pt afebrile with normal white count and taking po fluids well.      Left testicular pain: No evidence of torsion, mass or acute surgical process. Consider epididymitis although imaging was unremarkable. Continue to elevate scrotum.Patient started on doxycycline and will continue for total 10 days. At discharge pain much improved.    Suspected BPH: Followed by urology. Has follow up appointment 12/22/12. Foley cath inserted 12/21/12 for urinary retention. Urology following and recommending home with foley. Unable to evaluated prostate due to tenderness on left side. Per urology will see OP on 12/22/12 for further workup.    Alcohol abuse: No s/sx withdrawal during hospitalization  . History of von Willebrand's disease: seen by hematology during this hospitalization. Pt prefers no further workup   Cigarette smoker: Nicotine patch offered. Recommend cessation.    [name] retention: Likely related to #2. Will be discharged with  Foley intact. Follow up 12/22/12. Continue flomax    Procedures:  Consultations:  Urology  Discharge Exam: Filed Vitals:   12/19/12 1806 12/19/12 2212 12/20/12 0141 12/20/12 0557  BP: 125/78 120/74 111/79 116/70  Pulse: 86 98 83 93  Temp: 97.8 F (36.6 C) 101.9 F (38.8 C) 98.4 F (36.9 C) 98.9 F (37.2 C)  TempSrc: Oral Oral Oral Oral  Resp: 20 20 20  18  Height:      Weight:      SpO2: 100% 99% 99% 98%    General: well nourished NAD Cardiovascular: RRR No MGR No LE edema Respiratory: normal effort BS clear bilaterally no wheeze no rhonchi Abdomen : flat soft +BS non-tender to palpation.  GU: foley intact with golden clear urine.   Discharge Instructions     Medication List    TAKE these medications        aspirin EC 81 MG tablet  Take 81 mg by mouth daily.     bisacodyl 10 MG suppository  Commonly known as:  DULCOLAX  Place 1 suppository (10 mg total) rectally daily as needed.     doxycycline 100 MG tablet  Commonly known as:  VIBRA-TABS  Take 1 tablet (100 mg total) by mouth every 12 (twelve) hours.     folic acid 1 MG tablet  Commonly known as:  FOLVITE  Take 1 tablet (1 mg total) by mouth daily.     Glucosamine 500 MG Caps  Take 1 capsule by mouth daily.     multivitamin with minerals Tabs  Take 1 tablet by mouth daily.     neomycin-bacitracin-polymyxin [phone] ointment  Apply 1 application topically daily as needed (for irritation).     nicotine 14 mg/24hr patch  Commonly known as:  NICODERM CQ - dosed in mg/24 hours  Place 1 patch onto the skin daily.     omega-3 acid ethyl esters 1 G capsule  Commonly known as:  LOVAZA  Take 1 g by mouth 3 (three) times daily.     sulfamethoxazole-trimethoprim 800-160 MG per tablet  Commonly known as:  BACTRIM DS  Take 1 tablet by mouth 2 (two) times daily.     tamsulosin 0.4 MG Caps  Commonly known as:  FLOMAX  Take 1 capsule (0.4 mg total) by mouth daily after supper.     thiamine 100 MG tablet  Commonly known as:  VITAMIN B-1  Take 100 mg by mouth daily.     TING 1 % cream  Generic drug:  tolnaftate  Apply 1 application topically daily as needed (for irritation).       Allergies  Allergen Reactions  . Aspirin     Can take 81 mg dose, unable to take high dose due to medical hx.       The results of significant diagnostics from this hospitalization (including imaging, microbiology, ancillary and laboratory) are listed below for reference.    Significant Diagnostic Studies: Ct Abdomen Pelvis Wo Contrast  12/17/2012   *RADIOLOGY REPORT*  Clinical Data: Lower abdominal and pelvic pain.  Dysuria.  Back pain.  Chills.  CT ABDOMEN AND PELVIS WITHOUT CONTRAST  Technique:  Multidetector CT imaging of the abdomen and  pelvis was performed following the standard protocol without intravenous contrast.  Comparison: None.  Findings: No evidence of renal calculi or hydronephrosis.  No evidence of ureteral calculi or dilatation.  No bladder calculi identified.  A small fluid attenuation left renal cyst noted. Urinary bladder and prostate are unremarkable in appearance.  The liver, gallbladder, pancreas, spleen, and adrenal glands have a normal appearance on this noncontrast study.  No soft tissue masses are identified.  No evidence of inflammatory process or abnormal fluid collections.  Diverticulosis is seen involving the descending and sigmoid colon, however there is no evidence of diverticulitis. No evidence of dilated bowel loops or hernia.  IMPRESSION:  1.  No evidence of urolithiasis, hydronephrosis, or other acute  findings. 2.  Diverticulosis.  No radiographic evidence of diverticulitis.   Original Report Authenticated By: Myles Rosenthal, M.D.   US Scrotum  12/17/2012   *RADIOLOGY REPORT*  Clinical Data:  Left-sided testicular pain.  SCROTAL ULTRASOUND DOPPLER ULTRASOUND OF THE TESTICLES  Technique: Complete ultrasound examination of the testicles, epididymis, and other scrotal structures was performed.  Color and spectral Doppler ultrasound were also utilized to evaluate blood flow to the testicles.  Comparison:  None  Findings:  Right testis:  4.5 x 2.9 x 4.0 cm.  No evidence of testicular mass or microlithiasis.  Blood flow is seen within the right testicle on color Doppler ultrasound.  Left testis:  4.3 x 2.2 x 3.4 cm.  No evidence of testicular mass or microlithiasis.  Blood flow is seen within the left testicle on color Doppler ultrasound.  Right epididymis:  Difficult to visualize, but does not appear enlarged.  Left epididymis:  Difficult to visualize, but does not appear enlarged.  Hydrocele:  Moderate hydroceles are present bilaterally.  Varicocele:  A small to moderate right sided varicocele is seen along the  epididymis.  A probable small left varicocele also noted.  Pulsed Doppler interrogation of both testes demonstrates low resistance flow bilaterally.  IMPRESSION:  1.  No evidence of testicular mass or torsion. 2.  Moderate bilateral hydroceles. 3.  Small to moderate bilateral varicoceles, right side larger than left.   Original Report Authenticated By: Myles Rosenthal, M.D.   [place] Art/ven Flow Abd Pelv Doppler  12/17/2012   *RADIOLOGY REPORT*  Clinical Data:  Left-sided testicular pain.  SCROTAL ULTRASOUND DOPPLER ULTRASOUND OF THE TESTICLES  Technique: Complete ultrasound examination of the testicles, epididymis, and other scrotal structures was performed.  Color and spectral Doppler ultrasound were also utilized to evaluate blood flow to the testicles.  Comparison:  None  Findings:  Right testis:  4.5 x 2.9 x 4.0 cm.  No evidence of testicular mass or microlithiasis.  Blood flow is seen within the right testicle on color Doppler ultrasound.  Left testis:  4.3 x 2.2 x 3.4 cm.  No evidence of testicular mass or microlithiasis.  Blood flow is seen within the left testicle on color Doppler ultrasound.  Right epididymis:  Difficult to visualize, but does not appear enlarged.  Left epididymis:  Difficult to visualize, but does not appear enlarged.  Hydrocele:  Moderate hydroceles are present bilaterally.  Varicocele:  A small to moderate right sided varicocele is seen along the epididymis.  A probable small left varicocele also noted.  Pulsed Doppler interrogation of both testes demonstrates low resistance flow bilaterally.  IMPRESSION:  1.  No evidence of testicular mass or torsion. 2.  Moderate bilateral hydroceles. 3.  Small to moderate bilateral varicoceles, right side larger than left.   Original Report Authenticated By: Myles Rosenthal, M.D.    Microbiology: Recent Results (from the past 240 hour(s))  URINE CULTURE     Status: None   Collection Time    12/17/12  5:39 PM      Result Value Range Status   Specimen  Description URINE, CLEAN CATCH   Final   Special Requests A   Final   Culture  Setup Time 12/18/2012 21:18   Final   Colony Count >=100,000 COLONIES/ML   Final   Culture ESCHERICHIA COLI   Final   Report Status 12/20/2012 FINAL   Final   Organism ID, Bacteria ESCHERICHIA COLI   Final     Labs: Basic Metabolic Panel:  Recent Labs Lab  12/17/12 1837 12/18/12 0630 12/20/12 0553  NA 134* 134* 133*  K 4.0 3.8 3.7  CL 98 101 97  CO2 25 24 24   GLUCOSE 122* 115* 118*  BUN 8 8 9   CREATININE 0.73 0.62 0.54  CALCIUM 9.2 8.8 9.0   Liver Function Tests:  Recent Labs Lab 12/18/12 0630  AST 15  ALT 14  ALKPHOS 97  BILITOT 1.2  PROT 6.8  ALBUMIN 3.4*   No results found for this basename: LIPASE, AMYLASE,  in the last 168 hours No results found for this basename: AMMONIA,  in the last 168 hours CBC:  Recent Labs Lab 12/17/12 1837 12/18/12 0630 12/19/12 0506 12/20/12 0553  WBC 18.7* 15.0* 9.8 6.1  NEUTROABS 15.9*  --   --   --   HGB 14.6 14.0 13.7 14.4  HCT 43.3 41.8 40.8 41.4  MCV 99.3 99.5 98.3 95.4  PLT 173 164 168 180   Cardiac Enzymes: No results found for this basename: CKTOTAL, CKMB, CKMBINDEX, TROPONINI,  in the last 168 hours BNP: BNP (last 3 results) No results found for this basename: PROBNP,  in the last 8760 hours CBG:  Recent Labs Lab 12/19/12 1709  GLUCAP 157*       Signed:  BLACK,KAREN M  [facility] 12/20/2012, 12:14 PM

## 2012-12-20 NOTE — Progress Notes (Signed)
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 He eventually was told that this was felt to be von Willebrand's disease although he does not remember any of the details as to what type it may be. He has not seen a hematologist since 1979. He was seen both at [facility] as well as [facility].  He does not remember being given vasopressin prior to any surgical procedure she states. He is not sure whether his been given blood products. He does remember having excessive bleeding when he said his teeth extracted over the years from the upper ridge. He has no siblings with a disorder. He has 2 biological brothers. He has 2 adopted sisters. His parents are both alive without a bleeding disorder but he states his mother's father had a bleeding disorder.   He does not see blood in the stool or blood in his urine nor does he bleeding from his nose as he once did is a kid. He had nosebleeds all the time when he was young until approximately the age of 65.  He does not have weight loss presently. He has had fevers with this infection but appears to be responding to the antibiotics.  He is not married and has no children of his own.  He works here in town for [employer].  BP 130/80  Pulse 100  Temp(Src) 98 F (36.7 C) (Oral)  Resp 18  Ht 6\' 4"  (1.93 m)  Wt 206 lb 6.4 oz (93.622 kg)  BMI 25.13 kg/m2  SpO2 98%  Presently he is alert and oriented. He does have divergence  of gaze when he chooses to at this time. Facial symmetry is intact. He has an upper dental plate and multiple missing lower teeth. What teeth remaining are in poor repair. Tongue is unremarkable in the midline. There is mucous present in the posterior pharynx. He has no palpable lymphadenopathy in the cervical, supraclavicular, infraclavicular, axillary or anal areas. He has no distinct ecchymoses. He does not have any petechiae. He does have degenerative joint changes of his PIP joints. He has no gynecomastia. His lungs are clear but with diminished breath sounds. Heart shows a regular rhythm and rate without distinct murmur rub or gallop although he said he has had a murmur in the past. I could not hear 1 today. Abdomen remains soft and nontender without hepatosplenomegaly. There is no tenderness. Bowel sounds are very active. He does not have leg edema or arm edema.  His PT on admission was normal but his PTT was elevated to 40. His platelets are normal liver enzymes are unremarkable except for an indirect bilirubin which is minimally elevated 1.0.  My impression is that he has probable type I von Willebrand's disease. I think he should be characterized from a laboratory standpoint as best we can and will order his von Willebrand's multimers to be sent. If he needs surgery he may benefit from vasopressin prior surgery. If bleeding  were to occur during surgery FFP would be considered. We will like to see him in followup to clarify his bleeding disorder. He understands that we would like to see him in followup.

## 2012-12-20 NOTE — Progress Notes (Signed)
[phone]

## 2012-12-20 NOTE — Progress Notes (Signed)
Discharged home today with instructions given on  Medications,and follow up visits,patient verbalized understanding.Prescriptions sent with patient.No c/o pain or discomfort noted.Accompanied by staff to awaiting vehicle.

## 2012-12-20 NOTE — Progress Notes (Signed)
NAMEJAMARQUES, Oscar Sheppard              ACCOUNT NO.:  [id number]  MEDICAL RECORD NO.:  [id number]  LOCATION:  A324                          FACILITY:  [facility]  PHYSICIAN:  Ky Barban, M.D.DATE OF BIRTH:  1956-05-19  DATE OF PROCEDURE: DATE OF DISCHARGE:  12/20/2012                                PROGRESS NOTE   [name] is clinically feeling much better.  He did spike a temperature to 101.9 last night, but he is completely afebrile now.  His white count which was 15,000 upon admission, has come down to 6000. Clinically, he is responding well to Rocephin.  His sensitivities are back.  He can take oral Bactrim DS and if he stays afebrile today, he can be discharged home with Foley catheter.  He needs to be further cystoscoped in the office which I have discussed with the patient.  He will be going home with Bactrim DS 1 twice a day for 10 days.  Then once I scoped him in the office then I can decide what he needs to be done.     Ky Barban, M.D.     MIJ/MEDQ  D:  12/20/2012  T:  12/20/2012  Job:  [id number]

## 2012-12-20 NOTE — Discharge Summary (Signed)
[facility]  Derius Ghosh ZOX:[id number] DOB: February 19, 1956 DOA: 12/17/2012 PCP: No PCP Per Patient  Patient seen, independently examined and chart reviewed. I agree with exam, assessment and plan discussed with Toya Smothers, NP. Agree with discharge.  Interval history/Subjective: Overall he feels better. He was seen by the hematologist this morning. The patient does not want any further blood testing at this point.  Objective: Tm 101.9 last night. Vital stable. Appears calm, well.   Labs/studies: CBC and basic metabolic panel unremarkable. Urine culture noted.  Assessment:  UTI with possible sepsis on admission  Left testicular pain, improving, epididymitis was empirically treated  Probable type I von Willebrand's disease  Plan:  Discharge home today, complete antibiotics Bactrim and doxycycline.  Keep Foley on discharge  Followup with urology in 48 hours  If he needs surgery he may benefit from vasopressin prior surgery. If bleeding were to occur during surgery FFP would be considered.   Comment: Hematology evaluation and recommendations appreciated. The patient refuses ordered blood test for financial reasons.   Brendia Sacks, MD [facility] [phone]

## 2013-01-11 ENCOUNTER — Ambulatory Visit (HOSPITAL_COMMUNITY): Payer: BC Managed Care – PPO | Admitting: Oncology

## 2014-08-17 IMAGING — CT CT ABD-PELV W/O CM
2 of 3 series · 17 of 46 positions shown, 19 images · non-contrast
Comparison: None.

CLINICAL DATA: Lower abdominal and pelvic pain.  Dysuria.  Back
pain.  Chills.

CT ABDOMEN AND PELVIS WITHOUT CONTRAST
TECHNIQUE: Multidetector CT imaging of the abdomen and pelvis was
performed following the standard protocol without intravenous
contrast.

[Series 2: standard/full over (age)lbs 5.0 · axial · 0.69mm/px · z∈[-454,-54]mm · 14 of 92 slices shown, 16 images]
[im 6/92  soft-tissue]
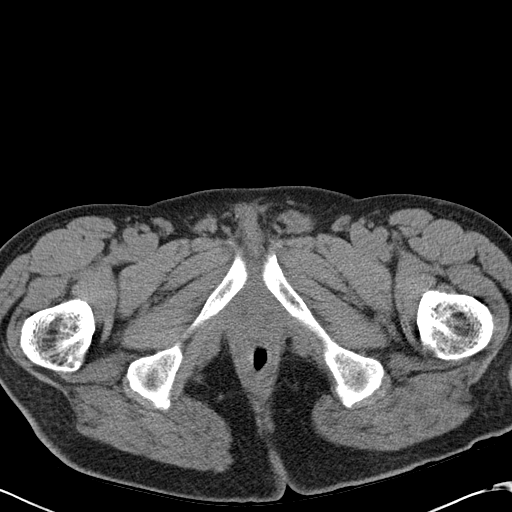
[im 6/92  bone]
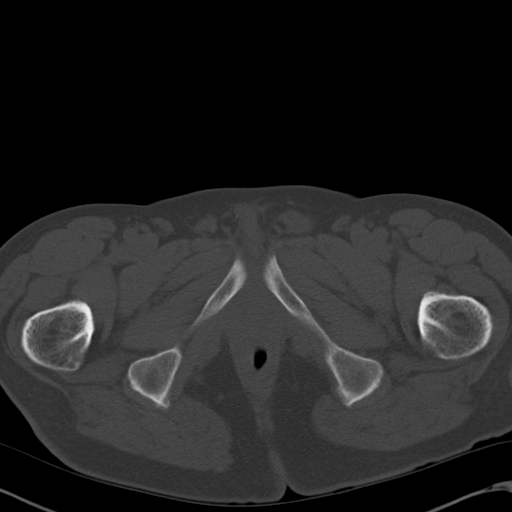
[im 12/92  soft-tissue]
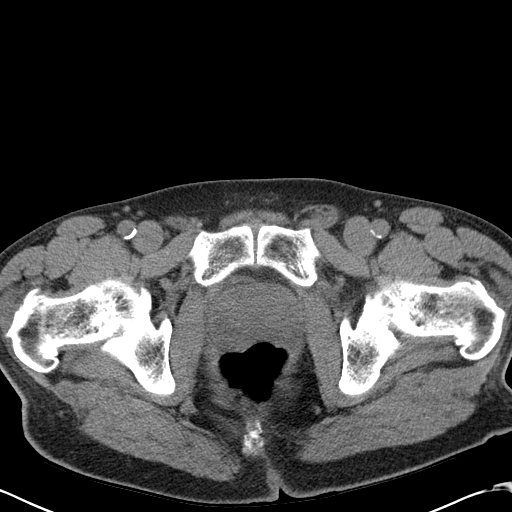
[im 18/92  soft-tissue]
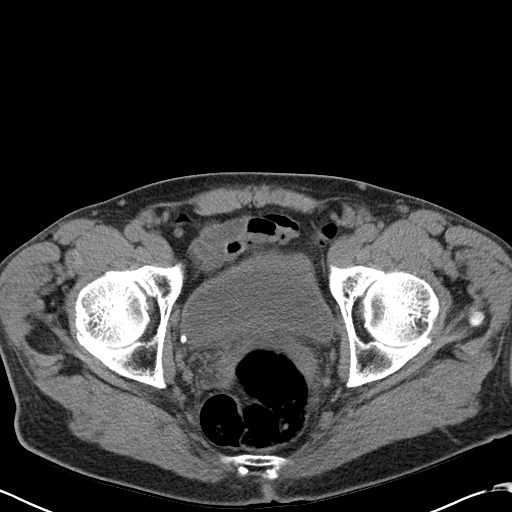
[im 24/92  soft-tissue]
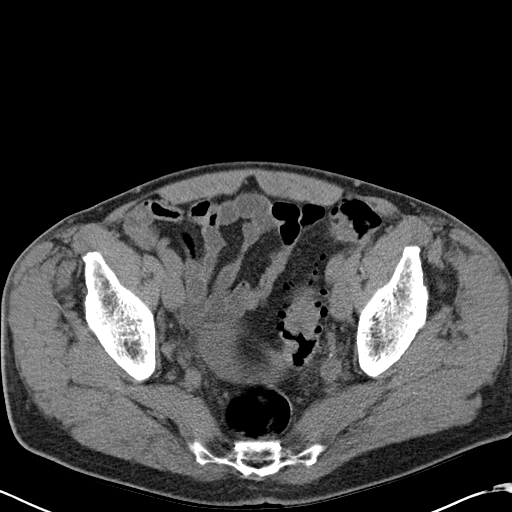
[im 30/92  soft-tissue]
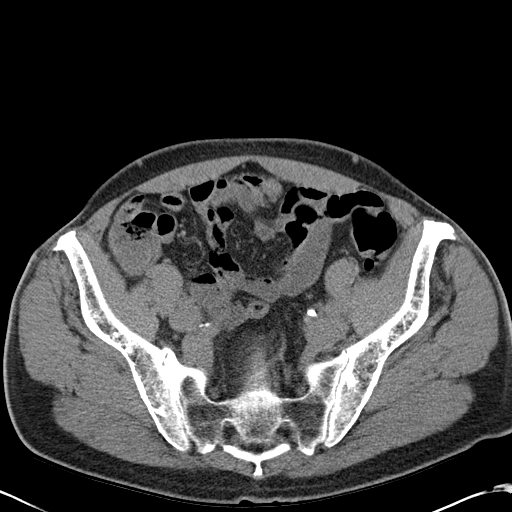
[im 36/92  soft-tissue]
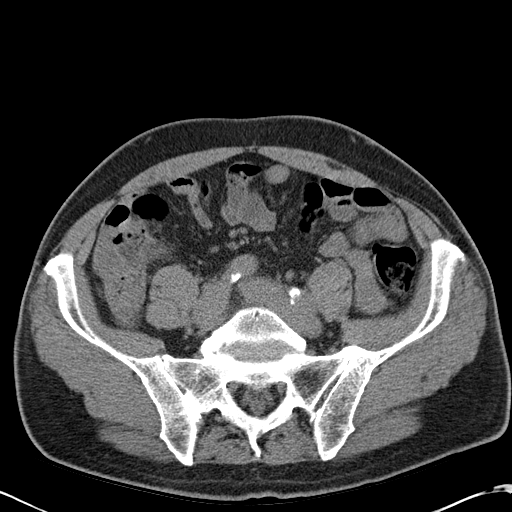
[im 42/92  soft-tissue]
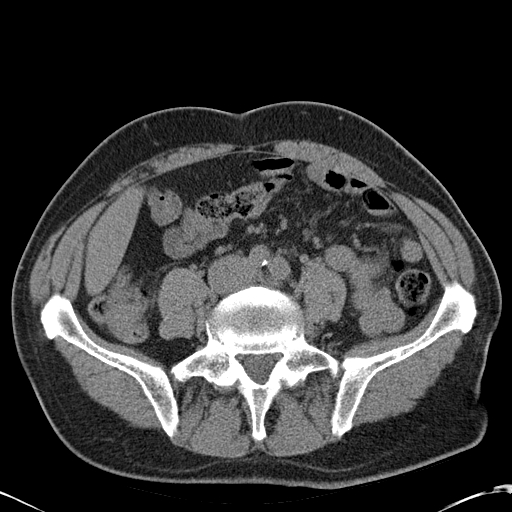
[im 50/92  soft-tissue]
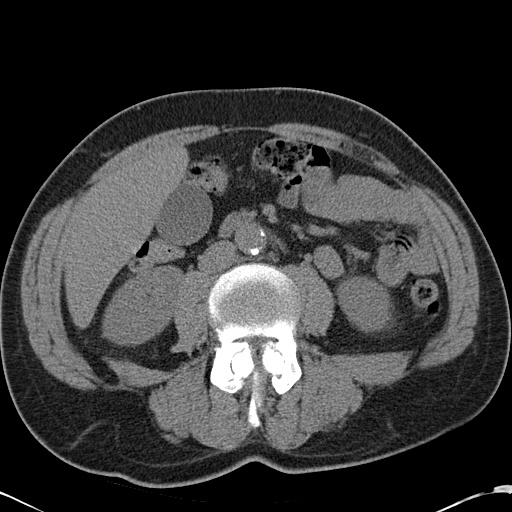
[im 56/92  soft-tissue]
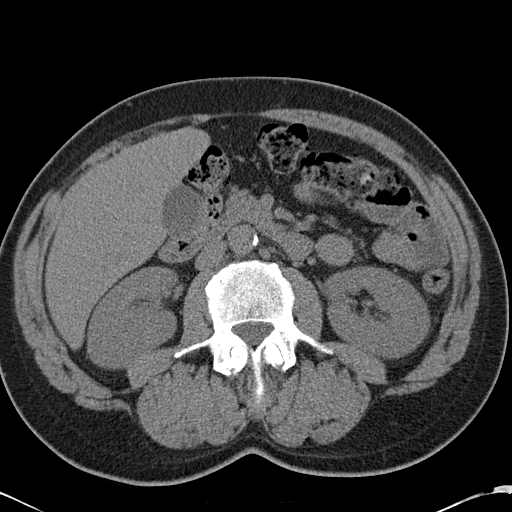
[im 56/92  bone]
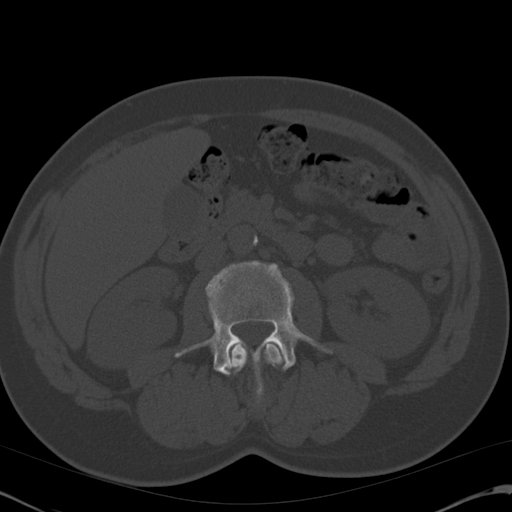
[im 62/92  soft-tissue]
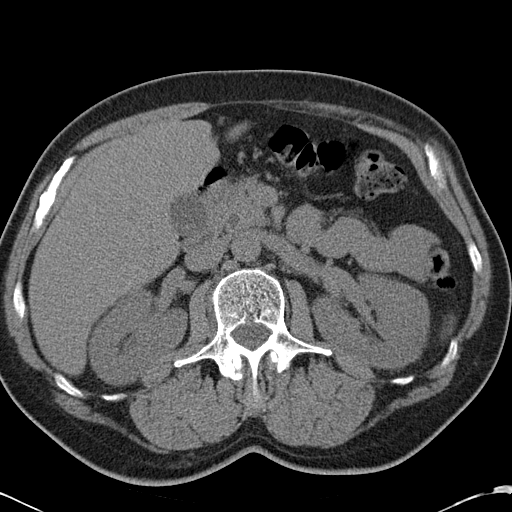
[im 68/92  soft-tissue]
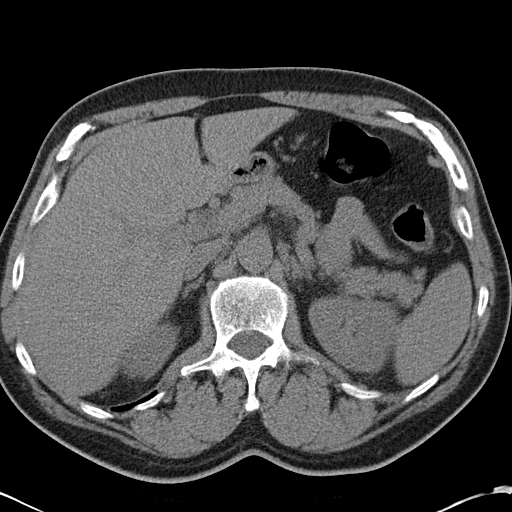
[im 74/92  soft-tissue]
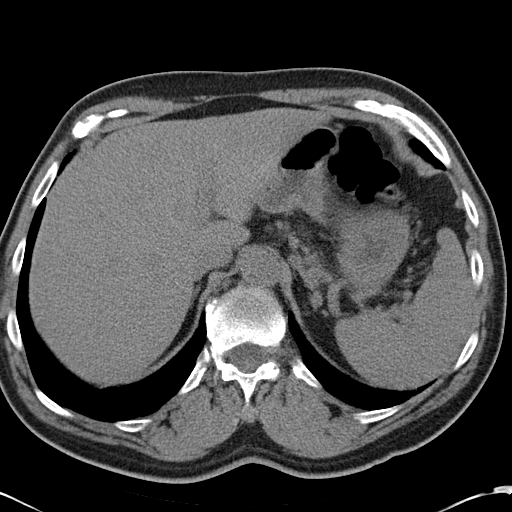
[im 80/92  soft-tissue]
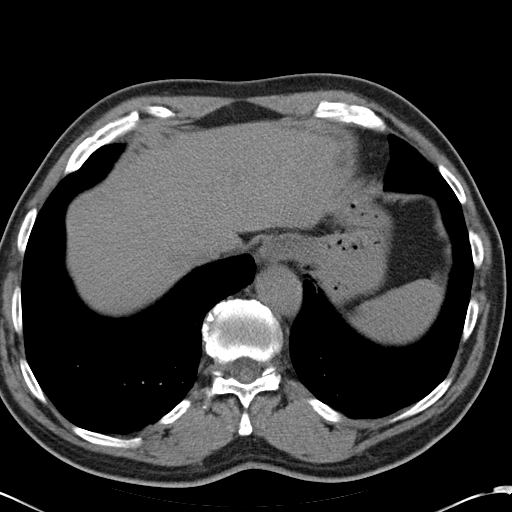
[im 86/92  soft-tissue]
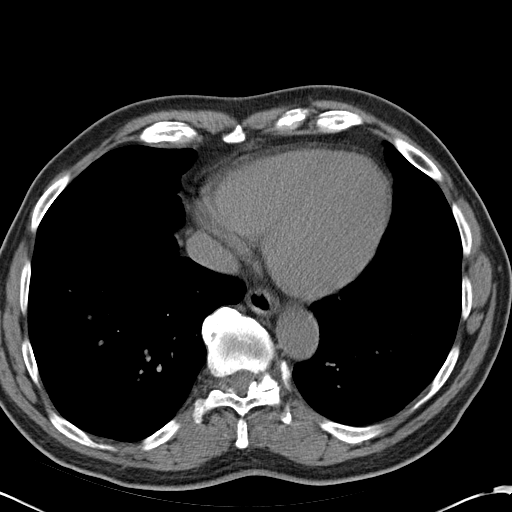

[Series 4: mpr coronal · coronal · 0.74mm/px · 3 of 102 slices shown]
[im 34/102  soft-tissue]
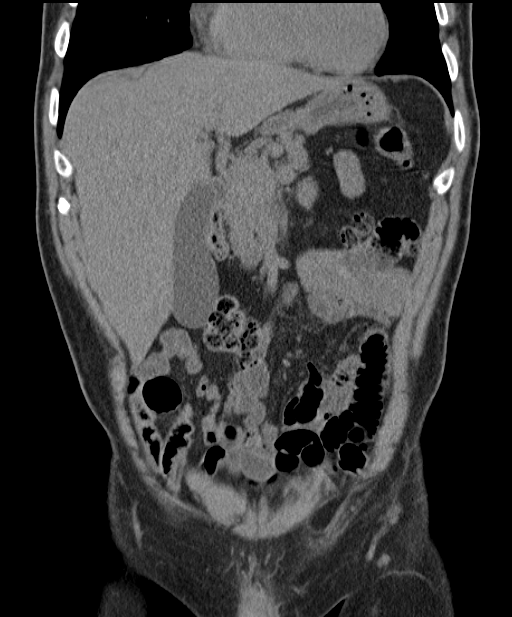
[im 45/102  soft-tissue]
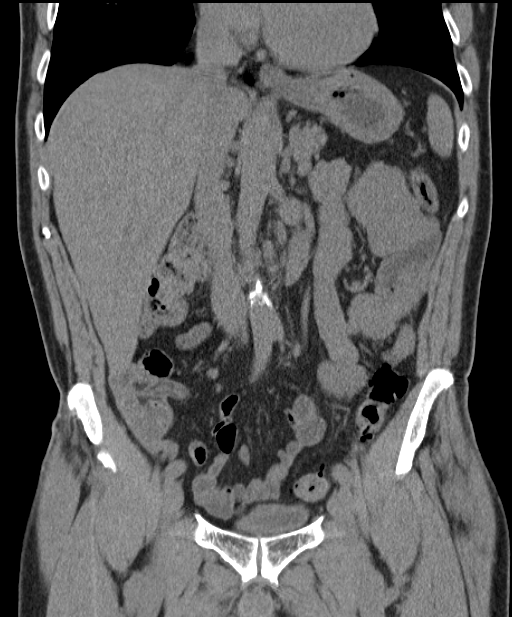
[im 57/102  soft-tissue]
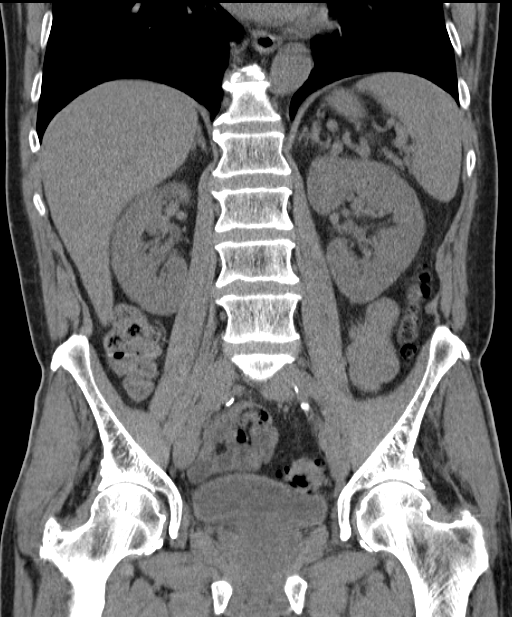

[17 of 46 positions shown; findings below may reference images not displayed]

FINDINGS: No evidence of renal calculi or hydronephrosis.  No
evidence of ureteral calculi or dilatation.  No bladder calculi
identified.  A small fluid attenuation left renal cyst noted.
Urinary bladder and prostate are unremarkable in appearance.

The liver, gallbladder, pancreas, spleen, and adrenal glands have a
normal appearance on this noncontrast study.  No soft tissue masses
are identified.  No evidence of inflammatory process or abnormal
fluid collections.  Diverticulosis is seen involving the descending
and sigmoid colon, however there is no evidence of diverticulitis.
No evidence of dilated bowel loops or hernia.
IMPRESSION: 1.  No evidence of urolithiasis, hydronephrosis, or other acute
findings.
2.  Diverticulosis.  No radiographic evidence of diverticulitis.

## 2019-02-02 DIAGNOSIS — R3911 Hesitancy of micturition: Secondary | ICD-10-CM | POA: Diagnosis not present

## 2019-02-02 DIAGNOSIS — K5900 Constipation, unspecified: Secondary | ICD-10-CM | POA: Diagnosis not present

## 2019-02-06 DIAGNOSIS — Z0000 Encounter for general adult medical examination without abnormal findings: Secondary | ICD-10-CM | POA: Diagnosis not present

## 2019-02-06 DIAGNOSIS — Z1321 Encounter for screening for nutritional disorder: Secondary | ICD-10-CM | POA: Diagnosis not present

## 2019-02-06 DIAGNOSIS — Z1329 Encounter for screening for other suspected endocrine disorder: Secondary | ICD-10-CM | POA: Diagnosis not present

## 2019-02-09 DIAGNOSIS — Z0001 Encounter for general adult medical examination with abnormal findings: Secondary | ICD-10-CM | POA: Diagnosis not present

## 2019-02-09 DIAGNOSIS — R3911 Hesitancy of micturition: Secondary | ICD-10-CM | POA: Diagnosis not present

## 2019-02-09 DIAGNOSIS — Z23 Encounter for immunization: Secondary | ICD-10-CM | POA: Diagnosis not present

## 2019-02-09 DIAGNOSIS — E782 Mixed hyperlipidemia: Secondary | ICD-10-CM | POA: Diagnosis not present

## 2019-03-16 DIAGNOSIS — R21 Rash and other nonspecific skin eruption: Secondary | ICD-10-CM | POA: Diagnosis not present

## 2019-03-16 DIAGNOSIS — I499 Cardiac arrhythmia, unspecified: Secondary | ICD-10-CM | POA: Diagnosis not present

## 2019-03-16 DIAGNOSIS — E785 Hyperlipidemia, unspecified: Secondary | ICD-10-CM | POA: Diagnosis not present

## 2019-03-20 ENCOUNTER — Encounter (INDEPENDENT_AMBULATORY_CARE_PROVIDER_SITE_OTHER): Payer: Self-pay | Admitting: *Deleted

## 2019-04-13 DIAGNOSIS — R21 Rash and other nonspecific skin eruption: Secondary | ICD-10-CM | POA: Diagnosis not present

## 2019-04-13 DIAGNOSIS — E785 Hyperlipidemia, unspecified: Secondary | ICD-10-CM | POA: Diagnosis not present

## 2019-07-10 ENCOUNTER — Other Ambulatory Visit (INDEPENDENT_AMBULATORY_CARE_PROVIDER_SITE_OTHER): Payer: Self-pay | Admitting: *Deleted

## 2019-07-10 DIAGNOSIS — Z1211 Encounter for screening for malignant neoplasm of colon: Secondary | ICD-10-CM

## 2019-07-17 DIAGNOSIS — E782 Mixed hyperlipidemia: Secondary | ICD-10-CM | POA: Diagnosis not present

## 2019-07-17 DIAGNOSIS — E559 Vitamin D deficiency, unspecified: Secondary | ICD-10-CM | POA: Diagnosis not present

## 2019-07-17 DIAGNOSIS — R7301 Impaired fasting glucose: Secondary | ICD-10-CM | POA: Diagnosis not present

## 2019-07-20 DIAGNOSIS — R3911 Hesitancy of micturition: Secondary | ICD-10-CM | POA: Diagnosis not present

## 2019-07-20 DIAGNOSIS — E782 Mixed hyperlipidemia: Secondary | ICD-10-CM | POA: Diagnosis not present

## 2019-07-20 DIAGNOSIS — K5900 Constipation, unspecified: Secondary | ICD-10-CM | POA: Diagnosis not present

## 2019-07-20 DIAGNOSIS — E559 Vitamin D deficiency, unspecified: Secondary | ICD-10-CM | POA: Diagnosis not present

## 2019-08-16 ENCOUNTER — Telehealth (INDEPENDENT_AMBULATORY_CARE_PROVIDER_SITE_OTHER): Payer: Self-pay | Admitting: *Deleted

## 2019-08-16 ENCOUNTER — Other Ambulatory Visit (INDEPENDENT_AMBULATORY_CARE_PROVIDER_SITE_OTHER): Payer: Self-pay | Admitting: *Deleted

## 2019-08-16 ENCOUNTER — Encounter (INDEPENDENT_AMBULATORY_CARE_PROVIDER_SITE_OTHER): Payer: Self-pay | Admitting: *Deleted

## 2019-08-16 DIAGNOSIS — Z1211 Encounter for screening for malignant neoplasm of colon: Secondary | ICD-10-CM

## 2019-08-16 NOTE — Telephone Encounter (Signed)
Referring MD/PCP: hall   Procedure: tcs  Reason/Indication:  screening  Has patient had this procedure before?  Yes, 2010  If so, when, by whom and where?    Is there a family history of colon cancer?  no  Who?  What age when diagnosed?    Is patient diabetic?   no      Does patient have prosthetic heart valve or mechanical valve?  no  Do you have a pacemaker/defibrillator?  no  Has patient ever had endocarditis/atrial fibrillation? no  Does patient use oxygen? no  Has patient had joint replacement within last 12 months?  on  Is patient constipated or do they take laxatives? no  Does patient have a history of alcohol/drug use?  o  Is patient on blood thinner such as Coumadin, Plavix and/or Aspirin? yes  Medications: tamsulosin 0.4 mg daily, rosuvastatin 5 mg daily, asa 81 mg daily  Allergies: pcn  Medication Adjustment per Dr Laural Golden: asa 2 days  Procedure date & time: 09/06/19

## 2019-08-16 NOTE — Telephone Encounter (Signed)
Patient needs suprep TCS sch'd 2/17

## 2019-08-18 MED ORDER — SUPREP BOWEL PREP KIT 17.5-3.13-1.6 GM/177ML PO SOLN: 1.0000 | Freq: Once | ORAL | 0 refills | Status: AC

## 2019-08-18 NOTE — Telephone Encounter (Signed)
Sent to pharmacy 

## 2019-08-28 NOTE — Telephone Encounter (Signed)
Colonoscopy with conscious sedation 

## 2019-08-29 NOTE — Telephone Encounter (Signed)
Colonoscopy with conscious sedation 

## 2019-09-04 ENCOUNTER — Other Ambulatory Visit: Payer: Self-pay

## 2019-09-04 ENCOUNTER — Other Ambulatory Visit (HOSPITAL_COMMUNITY)
Admission: RE | Admit: 2019-09-04 | Discharge: 2019-09-04 | Disposition: A | Discharge status: Home / Self Care | Payer: Medicare Other | Source: Ambulatory Visit | Attending: Internal Medicine | Admitting: Internal Medicine

## 2019-09-04 DIAGNOSIS — Z01812 Encounter for preprocedural laboratory examination: Secondary | ICD-10-CM | POA: Diagnosis not present

## 2019-09-04 DIAGNOSIS — Z20822 Contact with and (suspected) exposure to covid-19: Secondary | ICD-10-CM | POA: Diagnosis not present

## 2019-09-04 LAB — SARS CORONAVIRUS 2 (TAT 6-24 HRS): SARS Coronavirus 2: NEGATIVE

## 2019-09-06 ENCOUNTER — Encounter (HOSPITAL_COMMUNITY)
Admission: RE | Disposition: A | Discharge status: Home / Self Care | Payer: Self-pay | Source: Home / Self Care | Attending: Internal Medicine

## 2019-09-06 ENCOUNTER — Encounter (HOSPITAL_COMMUNITY): Payer: Self-pay | Admitting: Internal Medicine

## 2019-09-06 ENCOUNTER — Ambulatory Visit (HOSPITAL_COMMUNITY): Admit: 2019-09-06 | Payer: Medicare Other | Admitting: Internal Medicine

## 2019-09-06 ENCOUNTER — Encounter (HOSPITAL_COMMUNITY): Payer: Self-pay

## 2019-09-06 ENCOUNTER — Ambulatory Visit (HOSPITAL_COMMUNITY)
Admission: RE | Admit: 2019-09-06 | Discharge: 2019-09-06 | Disposition: A | Discharge status: Home / Self Care | Payer: Medicare Other | Attending: Internal Medicine | Admitting: Internal Medicine

## 2019-09-06 ENCOUNTER — Other Ambulatory Visit: Payer: Self-pay

## 2019-09-06 DIAGNOSIS — K5730 Diverticulosis of large intestine without perforation or abscess without bleeding: Secondary | ICD-10-CM | POA: Insufficient documentation

## 2019-09-06 DIAGNOSIS — Z8589 Personal history of malignant neoplasm of other organs and systems: Secondary | ICD-10-CM | POA: Insufficient documentation

## 2019-09-06 DIAGNOSIS — R011 Cardiac murmur, unspecified: Secondary | ICD-10-CM | POA: Diagnosis not present

## 2019-09-06 DIAGNOSIS — Z886 Allergy status to analgesic agent status: Secondary | ICD-10-CM | POA: Insufficient documentation

## 2019-09-06 DIAGNOSIS — Z833 Family history of diabetes mellitus: Secondary | ICD-10-CM | POA: Diagnosis not present

## 2019-09-06 DIAGNOSIS — Z79899 Other long term (current) drug therapy: Secondary | ICD-10-CM | POA: Diagnosis not present

## 2019-09-06 DIAGNOSIS — Z8371 Family history of colonic polyps: Secondary | ICD-10-CM | POA: Diagnosis not present

## 2019-09-06 DIAGNOSIS — K6389 Other specified diseases of intestine: Secondary | ICD-10-CM | POA: Diagnosis not present

## 2019-09-06 DIAGNOSIS — Z7982 Long term (current) use of aspirin: Secondary | ICD-10-CM | POA: Insufficient documentation

## 2019-09-06 DIAGNOSIS — D680 Von Willebrand's disease: Secondary | ICD-10-CM | POA: Diagnosis not present

## 2019-09-06 DIAGNOSIS — Z8249 Family history of ischemic heart disease and other diseases of the circulatory system: Secondary | ICD-10-CM | POA: Diagnosis not present

## 2019-09-06 DIAGNOSIS — Z1211 Encounter for screening for malignant neoplasm of colon: Secondary | ICD-10-CM | POA: Insufficient documentation

## 2019-09-06 DIAGNOSIS — F17290 Nicotine dependence, other tobacco product, uncomplicated: Secondary | ICD-10-CM | POA: Insufficient documentation

## 2019-09-06 HISTORY — PX: COLONOSCOPY: SHX5424

## 2019-09-06 SURGERY — COLONOSCOPY
Anesthesia: Moderate Sedation

## 2019-09-06 MED ORDER — MIDAZOLAM HCL 5 MG/5ML IJ SOLN
INTRAMUSCULAR | Status: DC | PRN
  Administered 2019-09-06 (×2): 2 mg via INTRAVENOUS

## 2019-09-06 MED ORDER — MEPERIDINE HCL 50 MG/ML IJ SOLN
INTRAMUSCULAR | Status: DC | PRN
  Administered 2019-09-06 (×2): 25 mg via INTRAVENOUS

## 2019-09-06 MED ORDER — MIDAZOLAM HCL 5 MG/5ML IJ SOLN
INTRAMUSCULAR | Status: AC
  Filled 2019-09-06: qty 10

## 2019-09-06 MED ORDER — METAMUCIL SMOOTH TEXTURE 58.6 % PO POWD: 1.0000 | Freq: Every day | ORAL | 12 refills | Status: AC

## 2019-09-06 MED ORDER — STERILE WATER FOR IRRIGATION IR SOLN
Status: DC | PRN
  Administered 2019-09-06: 1.5 mL

## 2019-09-06 MED ORDER — SODIUM CHLORIDE 0.9 % IV SOLN: INTRAVENOUS | Status: DC

## 2019-09-06 MED ORDER — MEPERIDINE HCL 50 MG/ML IJ SOLN
INTRAMUSCULAR | Status: AC
  Filled 2019-09-06: qty 1

## 2019-09-06 NOTE — Discharge Instructions (Signed)
Resume usual medications including low-dose aspirin past before. High-fiber diet. Metamucil 4 g or half to 1 pack by mouth every night. No driving for 24 hours. Next screening exam in 10 years.   Diverticulosis  Diverticulosis is a condition that develops when small pouches (diverticula) form in the wall of the large intestine (colon). The colon is where water is absorbed and stool (feces) is formed. The pouches form when the inside layer of the colon pushes through weak spots in the outer layers of the colon. You may have a few pouches or many of them. The pouches usually do not cause problems unless they become inflamed or infected. When this happens, the condition is called diverticulitis. What are the causes? The cause of this condition is not known. What increases the risk? The following factors may make you more likely to develop this condition:  Being older than age 66. Your risk for this condition increases with age. Diverticulosis is rare among people younger than age 70. By age 65, many people have it.  Eating a low-fiber diet.  Having frequent constipation.  Being overweight.  Not getting enough exercise.  Smoking.  Taking over-the-counter pain medicines, like aspirin and ibuprofen.  Having a family history of diverticulosis. What are the signs or symptoms? In most people, there are no symptoms of this condition. If you do have symptoms, they may include:  Bloating.  Cramps in the abdomen.  Constipation or diarrhea.  Pain in the lower left side of the abdomen. How is this diagnosed? Because diverticulosis usually has no symptoms, it is most often diagnosed during an exam for other colon problems. The condition may be diagnosed by:  Using a flexible scope to examine the colon (colonoscopy).  Taking an X-ray of the colon after dye has been put into the colon (barium enema).  Having a CT scan. How is this treated? You may not need treatment for this  condition. Your health care provider may recommend treatment to prevent problems. You may need treatment if you have symptoms or if you previously had diverticulitis. Treatment may include:  Eating a high-fiber diet.  Taking a fiber supplement.  Taking a live bacteria supplement (probiotic).  Taking medicine to relax your colon. Follow these instructions at home: Medicines  Take over-the-counter and prescription medicines only as told by your health care provider.  If told by your health care provider, take a fiber supplement or probiotic. Constipation prevention Your condition may cause constipation. To prevent or treat constipation, you may need to:  Drink enough fluid to keep your urine pale yellow.  Take over-the-counter or prescription medicines.  Eat foods that are high in fiber, such as beans, whole grains, and fresh fruits and vegetables.  Limit foods that are high in fat and processed sugars, such as fried or sweet foods.  General instructions  Try not to strain when you have a bowel movement.  Keep all follow-up visits as told by your health care provider. This is important. Contact a health care provider if you:  Have pain in your abdomen.  Have bloating.  Have cramps.  Have not had a bowel movement in 3 days. Get help right away if:  Your pain gets worse.  Your bloating becomes very bad.  You have a fever or chills, and your symptoms suddenly get worse.  You vomit.  You have bowel movements that are bloody or black.  You have bleeding from your rectum. Summary  Diverticulosis is a condition that develops when small  pouches (diverticula) form in the wall of the large intestine (colon).  You may have a few pouches or many of them.  This condition is most often diagnosed during an exam for other colon problems.  Treatment may include increasing the fiber in your diet, taking supplements, or taking medicines. This information is not intended to  replace advice given to you by your health care provider. Make sure you discuss any questions you have with your health care provider. Document Revised: 02/02/2019 Document Reviewed: 02/02/2019 Elsevier Patient Education  [place].   High-Fiber Diet Fiber, also called dietary fiber, is a type of carbohydrate that is found in fruits, vegetables, whole grains, and beans. A high-fiber diet can have many health benefits. Your health care provider may recommend a high-fiber diet to help:  Prevent constipation. Fiber can make your bowel movements more regular.  Lower your cholesterol.  Relieve the following conditions: ? Swelling of veins in the anus (hemorrhoids). ? Swelling and irritation (inflammation) of specific areas of the digestive tract (uncomplicated diverticulosis). ? A problem of the large intestine (colon) that sometimes causes pain and diarrhea (irritable bowel syndrome, IBS).  Prevent overeating as part of a weight-loss plan.  Prevent heart disease, type 2 diabetes, and certain cancers. What is my plan? The recommended daily fiber intake in grams (g) includes:  38 g for men age 72 or younger.  30 g for men over age 66.  66 g for women age 39 or younger.  21 g for women over age 41. You can get the recommended daily intake of dietary fiber by:  Eating a variety of fruits, vegetables, grains, and beans.  Taking a fiber supplement, if it is not possible to get enough fiber through your diet. What do I need to know about a high-fiber diet?  It is better to get fiber through food sources rather than from fiber supplements. There is not a lot of research about how effective supplements are.  Always check the fiber content on the nutrition facts label of any prepackaged food. Look for foods that contain 5 g of fiber or more per serving.  Talk with a diet and nutrition specialist (dietitian) if you have questions about specific foods that are recommended or not  recommended for your medical condition, especially if those foods are not listed below.  Gradually increase how much fiber you consume. If you increase your intake of dietary fiber too quickly, you may have bloating, cramping, or gas.  Drink plenty of water. Water helps you to digest fiber. What are tips for following this plan?  Eat a wide variety of high-fiber foods.  Make sure that half of the grains that you eat each day are whole grains.  Eat breads and cereals that are made with whole-grain flour instead of refined flour or white flour.  Eat brown rice, bulgur wheat, or millet instead of white rice.  Start the day with a breakfast that is high in fiber, such as a cereal that contains 5 g of fiber or more per serving.  Use beans in place of meat in soups, salads, and pasta dishes.  Eat high-fiber snacks, such as berries, raw vegetables, nuts, and popcorn.  Choose whole fruits and vegetables instead of processed forms like juice or sauce. What foods can I eat?  Fruits Berries. Pears. Apples. Oranges. Avocado. Prunes and raisins. Dried figs. Vegetables Sweet potatoes. Spinach. Kale. Artichokes. Cabbage. Broccoli. Cauliflower. Green peas. Carrots. Squash. Grains Whole-grain breads. Multigrain cereal. Oats and  oatmeal. Brown rice. Barley. Bulgur wheat. Circle. Quinoa. Bran muffins. Popcorn. Rye wafer crackers. Meats and other proteins Navy, kidney, and pinto beans. Soybeans. Split peas. Lentils. Nuts and seeds. Dairy Fiber-fortified yogurt. Beverages Fiber-fortified soy milk. Fiber-fortified orange juice. Other foods Fiber bars. The items listed above may not be a complete list of recommended foods and beverages. Contact a dietitian for more options. What foods are not recommended? Fruits Fruit juice. Cooked, strained fruit. Vegetables Fried potatoes. Canned vegetables. Well-cooked vegetables. Grains White bread. Pasta made with refined flour. White rice. Meats and  other proteins Fatty cuts of meat. Fried chicken or fried fish. Dairy Milk. Yogurt. Cream cheese. Sour cream. Fats and oils Butters. Beverages Soft drinks. Other foods Cakes and pastries. The items listed above may not be a complete list of foods and beverages to avoid. Contact a dietitian for more information. Summary  Fiber is a type of carbohydrate. It is found in fruits, vegetables, whole grains, and beans.  There are many health benefits of eating a high-fiber diet, such as preventing constipation, lowering blood cholesterol, helping with weight loss, and reducing your risk of heart disease, diabetes, and certain cancers.  Gradually increase your intake of fiber. Increasing too fast can result in cramping, bloating, and gas. Drink plenty of water while you increase your fiber.  The best sources of fiber include whole fruits and vegetables, whole grains, nuts, seeds, and beans. This information is not intended to replace advice given to you by your health care provider. Make sure you discuss any questions you have with your health care provider. Document Revised: 05/10/2017 Document Reviewed: 05/10/2017 Elsevier Patient Education  2020 [employer].

## 2019-09-06 NOTE — H&P (Signed)
Oscar Sheppard is an 64 y.o. male.   Chief Complaint: Patient is here for colonoscopy HPI: Patient 64 year old Caucasian male who is here for screening colonoscopy.  Last exam was normal 10 years ago.  He denies abdominal pain change in bowel habits or rectal bleeding. Family history is negative for CRC at his father and brother have had polyps.  Past Medical History:  Diagnosis Date  . 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   . Diverticulitis   . Heart murmur   . Von Willebrand disease (Oscar Sheppard)     Past Surgical History:  Procedure Laterality Date  . EYE SURGERY    . NASAL FRACTURE SURGERY      Family History  Problem Relation Age of Onset  . Diabetes Mother   . Coronary artery disease Mother   . Hydrocele Father   . Colon polyps Father   . Colon polyps Brother    Social History:  reports that he has been smoking cigars. He has been smoking about 0.50 packs per day. He has never used smokeless tobacco. He reports current alcohol use of about 4.0 standard drinks of alcohol per week. He reports that he does not use drugs.  Allergies:  Allergies  Allergen Reactions  . Aspirin     Can take 81 mg dose, unable to take high dose due to medical hx.     Medications Prior to Admission  Medication Sig Dispense Refill  . acetaminophen (TYLENOL) 500 MG tablet Take 500 mg by mouth every 8 (eight) hours as needed for headache.    . bismuth subsalicylate (PEPTO BISMOL) 262 MG/15ML suspension Take 30 mLs by mouth every 6 (six) hours as needed for indigestion.    . diphenhydramine-acetaminophen (TYLENOL PM) 25-500 MG TABS tablet Take 1 tablet by mouth at bedtime as needed (sleep).    . Multiple Vitamin (MULTIVITAMIN WITH MINERALS) TABS Take 1 tablet by mouth daily. (Patient taking differently: Take 1 tablet by mouth 3 (three) times a week. )    . Omega-3 Fatty Acids (FISH OIL PO) Take 1 capsule by mouth every 14 (fourteen) days.    . rosuvastatin (CRESTOR)  5 MG tablet Take 5 mg by mouth at bedtime.    . tamsulosin (FLOMAX) 0.4 MG CAPS Take 1 capsule (0.4 mg total) by mouth daily after supper. (Patient taking differently: Take 0.4 mg by mouth daily. ) 30 capsule 0  . Vitamin D, Ergocalciferol, (DRISDOL) 1.25 MG (50000 UNIT) CAPS capsule Take 50,000 Units by mouth every Saturday.    Marland Kitchen aspirin EC 81 MG tablet Take 81 mg by mouth daily as needed (achy).     . folic acid (FOLVITE) 1 MG tablet Take 1 tablet (1 mg total) by mouth daily. (Patient not taking: Reported on 08/30/2019)    . Tetrahydrozoline HCl (VISINE OP) Place 1 drop into both eyes daily as needed (irritation).    . triamcinolone cream (KENALOG) 0.5 % Apply 1 application topically 3 (three) times daily as needed for rash or itching.      No results found for this or any previous visit (from the past 48 hour(s)). No results found.  Review of Systems  Blood pressure 114/81, pulse 81, temperature 98.3 F (36.8 C), temperature source Oral, resp. rate 12, height 6\' 4"  (1.93 m), weight 108.4 kg, SpO2 97 %. Physical Exam  Constitutional: He appears well-developed and well-nourished.  HENT:  Mouth/Throat: Oropharynx is clear and moist.  Eyes: Conjunctivae are normal. No scleral  icterus.  Neck: No thyromegaly present.  Cardiovascular: Normal rate, regular rhythm and normal heart sounds.  No murmur heard. Respiratory: Effort normal and breath sounds normal.  GI: Soft. He exhibits no distension and no mass. There is no abdominal tenderness.  Musculoskeletal:        General: No edema.  Lymphadenopathy:    He has no cervical adenopathy.  Neurological: He is alert.  Skin: Skin is warm and dry.     Assessment/Plan Average risk screening colonoscopy.  Hildred Laser, MD 09/06/2019, 12:36 PM

## 2019-09-06 NOTE — Op Note (Signed)
[facility] Patient Name: Oscar Sheppard Procedure Date: 09/06/2019 12:17 PM MRN: [id number] Date of Birth: 01-May-1956 Attending MD: Hildred Laser , MD CSN: [id number] Age: 64 Admit Type: Outpatient Procedure:                Colonoscopy Indications:              Screening for colorectal malignant neoplasm Providers:                Hildred Laser, MD, Otis Peak B. Sharon Seller, RN, Nelma Rothman, Technician Referring MD:             Delphina Cahill, MD Medicines:                Meperidine 50 mg IV, Midazolam 4 mg IV Complications:            No immediate complications. Estimated Blood Loss:     Estimated blood loss: none. Procedure:                Pre-Anesthesia Assessment:                           - Prior to the procedure, a History and Physical                            was performed, and patient medications and                            allergies were reviewed. The patient's tolerance of                            previous anesthesia was also reviewed. The risks                            and benefits of the procedure and the sedation                            options and risks were discussed with the patient.                            All questions were answered, and informed consent                            was obtained. Prior Anticoagulants: The patient has                            taken no previous anticoagulant or antiplatelet                            agents except for aspirin. ASA Grade Assessment: II                            - A patient with mild systemic disease. After  reviewing the risks and benefits, the patient was                            deemed in satisfactory condition to undergo the                            procedure.                           After obtaining informed consent, the colonoscope                            was passed under direct vision. Throughout the                            procedure, the  patient's blood pressure, pulse, and                            oxygen saturations were monitored continuously. The                            [id number]-[id number]) scope was introduced through                            the anus and advanced to the the cecum, identified                            by appendiceal orifice and ileocecal valve. The                            colonoscopy was performed without difficulty. The                            patient tolerated the procedure well. The quality                            of the bowel preparation was good. The ileocecal                            valve, appendiceal orifice, and rectum were                            photographed. Scope In: 12:47:49 PM Scope Out: 12:59:29 PM Scope Withdrawal Time: 0 hours 5 minutes 17 seconds  Total Procedure Duration: 0 hours 11 minutes 40 seconds  Findings:      The perianal and digital rectal examinations were normal.      Scattered diverticula were found in the sigmoid colon.      An area of mildly congested mucosa was found in the sigmoid colon.      Mild pigmentation to distal half of colon      The retroflexed view of the distal rectum and anal verge was normal and       showed no anal or rectal abnormalities. Impression:               -  Diverticulosis in the sigmoid colon.                           - Patchy erythema and edema to mucosa in the                            sigmoid colon. Nonspecific finding.                           - Mild melanosis coli involving distal half of                            colon.                           - No specimens collected. Moderate Sedation:      Moderate (conscious) sedation was administered by the endoscopy nurse       and supervised by the endoscopist. The following parameters were       monitored: oxygen saturation, heart rate, blood pressure, CO2       capnography and response to care. Total physician intraservice time was       17  minutes. Recommendation:           - Patient has a contact number available for                            emergencies. The signs and symptoms of potential                            delayed complications were discussed with the                            patient. Return to normal activities tomorrow.                            Written discharge instructions were provided to the                            patient.                           - High fiber diet today.                           - Continue present medications.                           - Use sugar-free Metamucil one tablespoon PO daily.                           - Repeat colonoscopy in 10 years for screening                            purposes. Procedure Code(s):        --- Professional ---  45378, Colonoscopy, flexible; diagnostic, including                            collection of specimen(s) by brushing or washing,                            when performed (separate procedure)                           G0500, Moderate sedation services provided by the                            same physician or other qualified health care                            professional performing a gastrointestinal                            endoscopic service that sedation supports,                            requiring the presence of an independent trained                            observer to assist in the monitoring of the                            patient's level of consciousness and physiological                            status; initial 15 minutes of intra-service time;                            patient age 83 years or older (additional time may                            be reported with [id number], as appropriate) Diagnosis Code(s):        --- Professional ---                           Z12.11, Encounter for screening for malignant                            neoplasm of colon                           K63.89, Other  specified diseases of intestine                           K57.30, Diverticulosis of large intestine without                            perforation or abscess without bleeding CPT copyright 2019 American Medical Association. All rights reserved. The codes documented in this report are preliminary and upon coder review may  be  revised to meet current compliance requirements. Hildred Laser, MD Hildred Laser, MD 09/06/2019 1:09:47 PM This report has been signed electronically. Number of Addenda: 0

## 2019-11-03 DIAGNOSIS — E559 Vitamin D deficiency, unspecified: Secondary | ICD-10-CM | POA: Diagnosis not present

## 2019-11-03 DIAGNOSIS — E785 Hyperlipidemia, unspecified: Secondary | ICD-10-CM | POA: Diagnosis not present

## 2019-11-06 DIAGNOSIS — W57XXXA Bitten or stung by nonvenomous insect and other nonvenomous arthropods, initial encounter: Secondary | ICD-10-CM | POA: Diagnosis not present

## 2019-11-06 DIAGNOSIS — R3911 Hesitancy of micturition: Secondary | ICD-10-CM | POA: Diagnosis not present

## 2019-11-06 DIAGNOSIS — E559 Vitamin D deficiency, unspecified: Secondary | ICD-10-CM | POA: Diagnosis not present

## 2019-11-06 DIAGNOSIS — K5900 Constipation, unspecified: Secondary | ICD-10-CM | POA: Diagnosis not present

## 2019-11-06 DIAGNOSIS — Z7689 Persons encountering health services in other specified circumstances: Secondary | ICD-10-CM | POA: Diagnosis not present

## 2019-11-06 DIAGNOSIS — Z0001 Encounter for general adult medical examination with abnormal findings: Secondary | ICD-10-CM | POA: Diagnosis not present

## 2019-11-06 DIAGNOSIS — E782 Mixed hyperlipidemia: Secondary | ICD-10-CM | POA: Diagnosis not present

## 2020-05-03 DIAGNOSIS — Z0000 Encounter for general adult medical examination without abnormal findings: Secondary | ICD-10-CM | POA: Diagnosis not present

## 2020-05-03 DIAGNOSIS — E785 Hyperlipidemia, unspecified: Secondary | ICD-10-CM | POA: Diagnosis not present

## 2020-05-03 DIAGNOSIS — Z0001 Encounter for general adult medical examination with abnormal findings: Secondary | ICD-10-CM | POA: Diagnosis not present

## 2020-05-03 DIAGNOSIS — E559 Vitamin D deficiency, unspecified: Secondary | ICD-10-CM | POA: Diagnosis not present

## 2020-05-08 DIAGNOSIS — E559 Vitamin D deficiency, unspecified: Secondary | ICD-10-CM | POA: Diagnosis not present

## 2020-05-08 DIAGNOSIS — E782 Mixed hyperlipidemia: Secondary | ICD-10-CM | POA: Diagnosis not present

## 2020-05-08 DIAGNOSIS — K5900 Constipation, unspecified: Secondary | ICD-10-CM | POA: Diagnosis not present

## 2020-05-08 DIAGNOSIS — R3911 Hesitancy of micturition: Secondary | ICD-10-CM | POA: Diagnosis not present

## 2020-09-05 DIAGNOSIS — Z712 Person consulting for explanation of examination or test findings: Secondary | ICD-10-CM | POA: Diagnosis not present

## 2020-09-05 DIAGNOSIS — E559 Vitamin D deficiency, unspecified: Secondary | ICD-10-CM | POA: Diagnosis not present

## 2020-09-05 DIAGNOSIS — E785 Hyperlipidemia, unspecified: Secondary | ICD-10-CM | POA: Diagnosis not present

## 2020-09-05 DIAGNOSIS — Z0001 Encounter for general adult medical examination with abnormal findings: Secondary | ICD-10-CM | POA: Diagnosis not present

## 2020-09-05 DIAGNOSIS — Z79899 Other long term (current) drug therapy: Secondary | ICD-10-CM | POA: Diagnosis not present

## 2020-09-11 DIAGNOSIS — R600 Localized edema: Secondary | ICD-10-CM | POA: Diagnosis not present

## 2020-09-11 DIAGNOSIS — K5900 Constipation, unspecified: Secondary | ICD-10-CM | POA: Diagnosis not present

## 2020-09-11 DIAGNOSIS — E782 Mixed hyperlipidemia: Secondary | ICD-10-CM | POA: Diagnosis not present

## 2020-09-11 DIAGNOSIS — E559 Vitamin D deficiency, unspecified: Secondary | ICD-10-CM | POA: Diagnosis not present

## 2020-09-11 DIAGNOSIS — R945 Abnormal results of liver function studies: Secondary | ICD-10-CM | POA: Diagnosis not present

## 2020-09-11 DIAGNOSIS — R3911 Hesitancy of micturition: Secondary | ICD-10-CM | POA: Diagnosis not present

## 2020-12-11 DIAGNOSIS — E782 Mixed hyperlipidemia: Secondary | ICD-10-CM | POA: Diagnosis not present

## 2020-12-11 DIAGNOSIS — E559 Vitamin D deficiency, unspecified: Secondary | ICD-10-CM | POA: Diagnosis not present

## 2020-12-11 DIAGNOSIS — Z79899 Other long term (current) drug therapy: Secondary | ICD-10-CM | POA: Diagnosis not present

## 2020-12-11 DIAGNOSIS — Z131 Encounter for screening for diabetes mellitus: Secondary | ICD-10-CM | POA: Diagnosis not present

## 2020-12-18 DIAGNOSIS — E782 Mixed hyperlipidemia: Secondary | ICD-10-CM | POA: Diagnosis not present

## 2020-12-18 DIAGNOSIS — E559 Vitamin D deficiency, unspecified: Secondary | ICD-10-CM | POA: Diagnosis not present

## 2020-12-18 DIAGNOSIS — R945 Abnormal results of liver function studies: Secondary | ICD-10-CM | POA: Diagnosis not present

## 2020-12-18 DIAGNOSIS — R600 Localized edema: Secondary | ICD-10-CM | POA: Diagnosis not present

## 2020-12-18 DIAGNOSIS — Z131 Encounter for screening for diabetes mellitus: Secondary | ICD-10-CM | POA: Diagnosis not present

## 2020-12-18 DIAGNOSIS — K5900 Constipation, unspecified: Secondary | ICD-10-CM | POA: Diagnosis not present

## 2021-03-31 DIAGNOSIS — E559 Vitamin D deficiency, unspecified: Secondary | ICD-10-CM | POA: Diagnosis not present

## 2021-03-31 DIAGNOSIS — E782 Mixed hyperlipidemia: Secondary | ICD-10-CM | POA: Diagnosis not present

## 2021-03-31 DIAGNOSIS — Z131 Encounter for screening for diabetes mellitus: Secondary | ICD-10-CM | POA: Diagnosis not present

## 2021-03-31 DIAGNOSIS — R7301 Impaired fasting glucose: Secondary | ICD-10-CM | POA: Diagnosis not present

## 2021-04-08 DIAGNOSIS — D680 Von Willebrand's disease: Secondary | ICD-10-CM | POA: Diagnosis not present

## 2021-04-08 DIAGNOSIS — R600 Localized edema: Secondary | ICD-10-CM | POA: Diagnosis not present

## 2021-04-08 DIAGNOSIS — Z0001 Encounter for general adult medical examination with abnormal findings: Secondary | ICD-10-CM | POA: Diagnosis not present

## 2021-04-08 DIAGNOSIS — K5900 Constipation, unspecified: Secondary | ICD-10-CM | POA: Diagnosis not present

## 2021-04-08 DIAGNOSIS — E559 Vitamin D deficiency, unspecified: Secondary | ICD-10-CM | POA: Diagnosis not present

## 2021-04-08 DIAGNOSIS — L282 Other prurigo: Secondary | ICD-10-CM | POA: Diagnosis not present

## 2021-04-08 DIAGNOSIS — R7303 Prediabetes: Secondary | ICD-10-CM | POA: Diagnosis not present

## 2021-06-17 DIAGNOSIS — R197 Diarrhea, unspecified: Secondary | ICD-10-CM | POA: Diagnosis not present

## 2021-10-02 DIAGNOSIS — R7301 Impaired fasting glucose: Secondary | ICD-10-CM | POA: Diagnosis not present

## 2021-10-02 DIAGNOSIS — E782 Mixed hyperlipidemia: Secondary | ICD-10-CM | POA: Diagnosis not present

## 2021-10-02 DIAGNOSIS — E559 Vitamin D deficiency, unspecified: Secondary | ICD-10-CM | POA: Diagnosis not present

## 2021-10-02 DIAGNOSIS — Z131 Encounter for screening for diabetes mellitus: Secondary | ICD-10-CM | POA: Diagnosis not present

## 2021-10-07 DIAGNOSIS — R197 Diarrhea, unspecified: Secondary | ICD-10-CM | POA: Diagnosis not present

## 2021-10-07 DIAGNOSIS — R7303 Prediabetes: Secondary | ICD-10-CM | POA: Diagnosis not present

## 2021-10-07 DIAGNOSIS — Z131 Encounter for screening for diabetes mellitus: Secondary | ICD-10-CM | POA: Diagnosis not present

## 2021-10-07 DIAGNOSIS — R945 Abnormal results of liver function studies: Secondary | ICD-10-CM | POA: Diagnosis not present

## 2021-10-07 DIAGNOSIS — E782 Mixed hyperlipidemia: Secondary | ICD-10-CM | POA: Diagnosis not present

## 2021-10-07 DIAGNOSIS — F17201 Nicotine dependence, unspecified, in remission: Secondary | ICD-10-CM | POA: Diagnosis not present

## 2021-10-07 DIAGNOSIS — D6800 Von Willebrand disease, unspecified: Secondary | ICD-10-CM | POA: Diagnosis not present

## 2021-10-07 DIAGNOSIS — E559 Vitamin D deficiency, unspecified: Secondary | ICD-10-CM | POA: Diagnosis not present

## 2021-10-07 DIAGNOSIS — R600 Localized edema: Secondary | ICD-10-CM | POA: Diagnosis not present

## 2022-04-06 DIAGNOSIS — Z131 Encounter for screening for diabetes mellitus: Secondary | ICD-10-CM | POA: Diagnosis not present

## 2022-04-06 DIAGNOSIS — R7303 Prediabetes: Secondary | ICD-10-CM | POA: Diagnosis not present

## 2022-04-06 DIAGNOSIS — R7301 Impaired fasting glucose: Secondary | ICD-10-CM | POA: Diagnosis not present

## 2022-04-06 DIAGNOSIS — E782 Mixed hyperlipidemia: Secondary | ICD-10-CM | POA: Diagnosis not present

## 2022-04-13 DIAGNOSIS — R7303 Prediabetes: Secondary | ICD-10-CM | POA: Diagnosis not present

## 2022-04-13 DIAGNOSIS — R809 Proteinuria, unspecified: Secondary | ICD-10-CM | POA: Diagnosis not present

## 2022-04-13 DIAGNOSIS — F17201 Nicotine dependence, unspecified, in remission: Secondary | ICD-10-CM | POA: Diagnosis not present

## 2022-04-13 DIAGNOSIS — L0291 Cutaneous abscess, unspecified: Secondary | ICD-10-CM | POA: Diagnosis not present

## 2022-04-13 DIAGNOSIS — E559 Vitamin D deficiency, unspecified: Secondary | ICD-10-CM | POA: Diagnosis not present

## 2022-04-13 DIAGNOSIS — D6800 Von Willebrand disease, unspecified: Secondary | ICD-10-CM | POA: Diagnosis not present

## 2022-04-13 DIAGNOSIS — R945 Abnormal results of liver function studies: Secondary | ICD-10-CM | POA: Diagnosis not present

## 2022-04-13 DIAGNOSIS — R600 Localized edema: Secondary | ICD-10-CM | POA: Diagnosis not present

## 2022-04-13 DIAGNOSIS — E782 Mixed hyperlipidemia: Secondary | ICD-10-CM | POA: Diagnosis not present

## 2022-04-13 DIAGNOSIS — Z131 Encounter for screening for diabetes mellitus: Secondary | ICD-10-CM | POA: Diagnosis not present

## 2022-10-06 ENCOUNTER — Encounter (HOSPITAL_COMMUNITY): Payer: Self-pay | Admitting: Nurse Practitioner

## 2022-10-06 ENCOUNTER — Other Ambulatory Visit (HOSPITAL_COMMUNITY): Payer: Self-pay | Admitting: Nurse Practitioner

## 2022-10-06 ENCOUNTER — Ambulatory Visit (HOSPITAL_COMMUNITY)
Admission: RE | Admit: 2022-10-06 | Discharge: 2022-10-06 | Disposition: A | Discharge status: Home / Self Care | Payer: Medicare Other | Source: Ambulatory Visit | Attending: Nurse Practitioner | Admitting: Nurse Practitioner

## 2022-10-06 DIAGNOSIS — M546 Pain in thoracic spine: Secondary | ICD-10-CM | POA: Diagnosis not present

## 2022-10-06 DIAGNOSIS — E782 Mixed hyperlipidemia: Secondary | ICD-10-CM | POA: Diagnosis not present

## 2022-10-06 DIAGNOSIS — R109 Unspecified abdominal pain: Secondary | ICD-10-CM | POA: Diagnosis not present

## 2022-10-06 DIAGNOSIS — Z131 Encounter for screening for diabetes mellitus: Secondary | ICD-10-CM | POA: Diagnosis not present

## 2022-10-06 DIAGNOSIS — R7303 Prediabetes: Secondary | ICD-10-CM | POA: Diagnosis not present

## 2022-10-09 DIAGNOSIS — Z0000 Encounter for general adult medical examination without abnormal findings: Secondary | ICD-10-CM | POA: Diagnosis not present

## 2022-10-12 DIAGNOSIS — E782 Mixed hyperlipidemia: Secondary | ICD-10-CM | POA: Diagnosis not present

## 2022-10-12 DIAGNOSIS — R600 Localized edema: Secondary | ICD-10-CM | POA: Diagnosis not present

## 2022-10-12 DIAGNOSIS — R7303 Prediabetes: Secondary | ICD-10-CM | POA: Diagnosis not present

## 2022-10-12 DIAGNOSIS — R945 Abnormal results of liver function studies: Secondary | ICD-10-CM | POA: Diagnosis not present

## 2022-10-12 DIAGNOSIS — D6800 Von Willebrand disease, unspecified: Secondary | ICD-10-CM | POA: Diagnosis not present

## 2022-10-12 DIAGNOSIS — Z131 Encounter for screening for diabetes mellitus: Secondary | ICD-10-CM | POA: Diagnosis not present

## 2022-10-12 DIAGNOSIS — R7401 Elevation of levels of liver transaminase levels: Secondary | ICD-10-CM | POA: Diagnosis not present

## 2022-10-12 DIAGNOSIS — E559 Vitamin D deficiency, unspecified: Secondary | ICD-10-CM | POA: Diagnosis not present

## 2022-10-12 DIAGNOSIS — F17201 Nicotine dependence, unspecified, in remission: Secondary | ICD-10-CM | POA: Diagnosis not present

## 2022-10-12 DIAGNOSIS — R809 Proteinuria, unspecified: Secondary | ICD-10-CM | POA: Diagnosis not present

## 2022-12-19 DIAGNOSIS — J0100 Acute maxillary sinusitis, unspecified: Secondary | ICD-10-CM | POA: Diagnosis not present

## 2022-12-19 DIAGNOSIS — H6121 Impacted cerumen, right ear: Secondary | ICD-10-CM | POA: Diagnosis not present

## 2022-12-19 DIAGNOSIS — F17210 Nicotine dependence, cigarettes, uncomplicated: Secondary | ICD-10-CM | POA: Diagnosis not present

## 2022-12-19 DIAGNOSIS — J330 Polyp of nasal cavity: Secondary | ICD-10-CM | POA: Diagnosis not present

## 2023-04-08 DIAGNOSIS — Z131 Encounter for screening for diabetes mellitus: Secondary | ICD-10-CM | POA: Diagnosis not present

## 2023-04-08 DIAGNOSIS — E559 Vitamin D deficiency, unspecified: Secondary | ICD-10-CM | POA: Diagnosis not present

## 2023-04-08 DIAGNOSIS — E782 Mixed hyperlipidemia: Secondary | ICD-10-CM | POA: Diagnosis not present

## 2023-04-08 DIAGNOSIS — R7303 Prediabetes: Secondary | ICD-10-CM | POA: Diagnosis not present

## 2023-04-14 DIAGNOSIS — E782 Mixed hyperlipidemia: Secondary | ICD-10-CM | POA: Diagnosis not present

## 2023-04-14 DIAGNOSIS — R945 Abnormal results of liver function studies: Secondary | ICD-10-CM | POA: Diagnosis not present

## 2023-04-14 DIAGNOSIS — E559 Vitamin D deficiency, unspecified: Secondary | ICD-10-CM | POA: Diagnosis not present

## 2023-04-14 DIAGNOSIS — R600 Localized edema: Secondary | ICD-10-CM | POA: Diagnosis not present

## 2023-04-14 DIAGNOSIS — K5730 Diverticulosis of large intestine without perforation or abscess without bleeding: Secondary | ICD-10-CM | POA: Diagnosis not present

## 2023-04-14 DIAGNOSIS — R7303 Prediabetes: Secondary | ICD-10-CM | POA: Diagnosis not present

## 2023-04-14 DIAGNOSIS — D6800 Von Willebrand disease, unspecified: Secondary | ICD-10-CM | POA: Diagnosis not present

## 2023-04-14 DIAGNOSIS — R809 Proteinuria, unspecified: Secondary | ICD-10-CM | POA: Diagnosis not present

## 2023-04-14 DIAGNOSIS — Z131 Encounter for screening for diabetes mellitus: Secondary | ICD-10-CM | POA: Diagnosis not present

## 2023-04-14 DIAGNOSIS — M609 Myositis, unspecified: Secondary | ICD-10-CM | POA: Diagnosis not present

## 2023-04-23 ENCOUNTER — Encounter: Payer: Self-pay | Admitting: Orthopedic Surgery

## 2023-04-23 ENCOUNTER — Ambulatory Visit (INDEPENDENT_AMBULATORY_CARE_PROVIDER_SITE_OTHER): Payer: Medicare Other | Admitting: Orthopedic Surgery

## 2023-04-23 ENCOUNTER — Other Ambulatory Visit (INDEPENDENT_AMBULATORY_CARE_PROVIDER_SITE_OTHER): Payer: Medicare Other

## 2023-04-23 VITALS — BP 144/89 | HR 91 | Ht 75.0 in | Wt 264.0 lb

## 2023-04-23 DIAGNOSIS — M545 Low back pain, unspecified: Secondary | ICD-10-CM

## 2023-04-23 MED ORDER — PREDNISONE 10 MG (21) PO TBPK: ORAL_TABLET | ORAL | 0 refills | Status: AC

## 2023-04-23 NOTE — Progress Notes (Signed)
New Patient Visit  Assessment: Oscar Sheppard is a 67 y.o. male with the following: 1. Lumbar pain  Plan: Demarrio Menges has pain and cramping in his lower back, primarily on the left side.  No specific injury.  He admits to decreased activity.  Pain consistent with weakness and fatigue in his core muscles.  Low concern for chronic nerve compression.  He has good lower body strength.  As such, I am recommending PT and we will place a referral.  Short course of prednisone. He is in agreement with this plan.  Follow up as needed.   Follow-up: Return if symptoms worsen or fail to improve.  Subjective:  Chief Complaint  Patient presents with   Left low back pain    Chronic - progressive worsening    History of Present Illness: Oscar Sheppard is a 67 y.o. male who has been referred by  [name], FNP for evaluation of left hip pain.  He notes progressive worsening pain over the lateral and posterior hip.  No pain in his groin.  This has been ongoing for a while and has dealt with this in the past.  He notes reduced activity and weight gain, at least in part because of quitting smoking.  His pain worsens as he stands for longer periods of time.  Occasional pain into his leg.  No numbness or tingling.     Review of Systems: No fevers or chills No numbness or tingling No chest pain No shortness of breath No bowel or bladder dysfunction No GI distress No headaches   Medical History:  Past Medical History:  Diagnosis Date   Cancer (HCC)    Diverticulitis    Heart murmur    Von Willebrand disease (H[facility])     Past Surgical History:  Procedure Laterality Date   COLONOSCOPY N/A 09/06/2019   Procedure: COLONOSCOPY;  Surgeon: Malissa Hippo, MD;  Location: [facility];  Service: Endoscopy;  Laterality: N/A;  1030   EYE SURGERY     NASAL FRACTURE SURGERY      Family History  Problem Relation Age of Onset   Diabetes Mother    Coronary artery disease Mother    Hydrocele  Father    Colon polyps Father    Colon polyps Brother    Social History   Tobacco Use   Smoking status: Former    Types: Cigars, Cigarettes   Smokeless tobacco: Never   Tobacco comments:    6 cigars per day  Vaping Use   Vaping status: Never Used  Substance Use Topics   Alcohol use: Yes    Alcohol/week: 4.0 standard drinks of alcohol    Types: 4 Cans of beer per week   Drug use: No    Allergies  Allergen Reactions   Aspirin     Can take 81 mg dose, unable to take high dose due to medical hx.     Current Meds  Medication Sig   acetaminophen (TYLENOL) 500 MG tablet Take 500 mg by mouth every 8 (eight) hours as needed for headache.   aspirin EC 81 MG tablet Take 81 mg by mouth daily as needed (achy).    bismuth subsalicylate (PEPTO BISMOL) 262 MG/15ML suspension Take 30 mLs by mouth every 6 (six) hours as needed for indigestion.   diphenhydramine-acetaminophen (TYLENOL PM) 25-500 MG TABS tablet Take 1 tablet by mouth at bedtime as needed (sleep).   Multiple Vitamin (MULTIVITAMIN WITH MINERALS) TABS Take 1 tablet by mouth daily. (Patient taking differently:  Take 1 tablet by mouth 3 (three) times a week.)   Omega-3 Fatty Acids (FISH OIL PO) Take 1 capsule by mouth every 14 (fourteen) days.   predniSONE (STERAPRED UNI-PAK 21 TAB) 10 MG (21) TBPK tablet 10 mg DS 12 as directed   psyllium (METAMUCIL SMOOTH TEXTURE) 58.6 % powder Take 1 packet by mouth at bedtime.   rosuvastatin (CRESTOR) 5 MG tablet Take 5 mg by mouth at bedtime.   tamsulosin (FLOMAX) 0.4 MG CAPS Take 1 capsule (0.4 mg total) by mouth daily after supper. (Patient taking differently: Take 0.4 mg by mouth daily.)   Tetrahydrozoline HCl (VISINE OP) Place 1 drop into both eyes daily as needed (irritation).   triamcinolone cream (KENALOG) 0.5 % Apply 1 application topically 3 (three) times daily as needed for rash or itching.   Vitamin D, Ergocalciferol, (DRISDOL) 1.25 MG (50000 UNIT) CAPS capsule Take 50,000 Units by  mouth every Saturday.    Objective: BP (!) 144/89   Pulse 91   Ht 6\' 3"  (1.905 m)   Wt 264 lb (119.7 kg)   BMI 33.00 kg/m   Physical Exam:  General: Elderly male., Alert and oriented., and No acute distress. Gait: Slow, steady gait.  Low back without deformity.  No point tenderness over the lower back.  No tenderness over the greater trochanter.  BLE strength is 5/5. Sensation intact distally.  Negative straight leg raise.    IMAGING: I personally ordered and reviewed the following images  AP pelvis was obtained in clinic today.  No acute injuries.  Bilateral hips with some signs of degenerative changes.  Small osteophytes.  Loss of joint space, with some space maintained.  No bony lesions.   Impression: negative AP pelvis.    Standing lumbar spine XR obtained in clinic today.  No acute injuries.  Evidence of degenerative changes throughout the spine.  Maintained disc height.  Anterior based osteophytes.  Trace anterolisthesis at L3-4.  Bridging anterior osteophytes in lower thoracic spine.    Impression: Lumbar spondylosis, with trace anterolisthesis at L3-4   New Medications:  Meds ordered this encounter  Medications   predniSONE (STERAPRED UNI-PAK 21 TAB) 10 MG (21) TBPK tablet    Sig: 10 mg DS 12 as directed    Dispense:  48 tablet    Refill:  0      [name], MD  04/23/2023 11:27 PM

## 2023-07-12 DIAGNOSIS — R062 Wheezing: Secondary | ICD-10-CM | POA: Diagnosis not present

## 2023-10-08 DIAGNOSIS — E559 Vitamin D deficiency, unspecified: Secondary | ICD-10-CM | POA: Diagnosis not present

## 2023-10-08 DIAGNOSIS — R7303 Prediabetes: Secondary | ICD-10-CM | POA: Diagnosis not present

## 2023-10-08 DIAGNOSIS — E782 Mixed hyperlipidemia: Secondary | ICD-10-CM | POA: Diagnosis not present

## 2023-10-08 DIAGNOSIS — Z131 Encounter for screening for diabetes mellitus: Secondary | ICD-10-CM | POA: Diagnosis not present

## 2023-10-14 DIAGNOSIS — Z131 Encounter for screening for diabetes mellitus: Secondary | ICD-10-CM | POA: Diagnosis not present

## 2023-10-14 DIAGNOSIS — R7303 Prediabetes: Secondary | ICD-10-CM | POA: Diagnosis not present

## 2023-10-14 DIAGNOSIS — R600 Localized edema: Secondary | ICD-10-CM | POA: Diagnosis not present

## 2023-10-14 DIAGNOSIS — R945 Abnormal results of liver function studies: Secondary | ICD-10-CM | POA: Diagnosis not present

## 2023-10-14 DIAGNOSIS — Z0000 Encounter for general adult medical examination without abnormal findings: Secondary | ICD-10-CM | POA: Diagnosis not present

## 2023-10-14 DIAGNOSIS — G720 Drug-induced myopathy: Secondary | ICD-10-CM | POA: Diagnosis not present

## 2023-10-14 DIAGNOSIS — E559 Vitamin D deficiency, unspecified: Secondary | ICD-10-CM | POA: Diagnosis not present

## 2023-10-14 DIAGNOSIS — Z0001 Encounter for general adult medical examination with abnormal findings: Secondary | ICD-10-CM | POA: Diagnosis not present

## 2023-10-14 DIAGNOSIS — H1011 Acute atopic conjunctivitis, right eye: Secondary | ICD-10-CM | POA: Diagnosis not present

## 2023-10-14 DIAGNOSIS — E782 Mixed hyperlipidemia: Secondary | ICD-10-CM | POA: Diagnosis not present

## 2023-11-09 ENCOUNTER — Other Ambulatory Visit (HOSPITAL_COMMUNITY): Payer: Self-pay | Admitting: Family Medicine

## 2023-11-09 DIAGNOSIS — W57XXXA Bitten or stung by nonvenomous insect and other nonvenomous arthropods, initial encounter: Secondary | ICD-10-CM | POA: Diagnosis not present

## 2023-11-09 DIAGNOSIS — R2232 Localized swelling, mass and lump, left upper limb: Secondary | ICD-10-CM

## 2023-11-09 DIAGNOSIS — S80862A Insect bite (nonvenomous), left lower leg, initial encounter: Secondary | ICD-10-CM | POA: Diagnosis not present

## 2023-11-09 DIAGNOSIS — S20161A Insect bite (nonvenomous) of breast, right breast, initial encounter: Secondary | ICD-10-CM | POA: Diagnosis not present

## 2023-11-09 DIAGNOSIS — S40862A Insect bite (nonvenomous) of left upper arm, initial encounter: Secondary | ICD-10-CM | POA: Diagnosis not present

## 2023-11-10 ENCOUNTER — Other Ambulatory Visit (HOSPITAL_COMMUNITY): Payer: Self-pay | Admitting: Family Medicine

## 2023-11-10 DIAGNOSIS — R2232 Localized swelling, mass and lump, left upper limb: Secondary | ICD-10-CM

## 2023-11-15 ENCOUNTER — Ambulatory Visit (HOSPITAL_COMMUNITY)

## 2023-11-19 ENCOUNTER — Ambulatory Visit (HOSPITAL_COMMUNITY)
Admission: RE | Admit: 2023-11-19 | Discharge: 2023-11-19 | Disposition: A | Discharge status: Home / Self Care | Source: Ambulatory Visit | Attending: Family Medicine | Admitting: Family Medicine

## 2023-11-19 DIAGNOSIS — R2232 Localized swelling, mass and lump, left upper limb: Secondary | ICD-10-CM | POA: Insufficient documentation

## 2023-11-19 DIAGNOSIS — R222 Localized swelling, mass and lump, trunk: Secondary | ICD-10-CM | POA: Diagnosis not present

## 2023-11-19 DIAGNOSIS — M799 Soft tissue disorder, unspecified: Secondary | ICD-10-CM | POA: Diagnosis not present

## 2023-12-06 DIAGNOSIS — S40862A Insect bite (nonvenomous) of left upper arm, initial encounter: Secondary | ICD-10-CM | POA: Diagnosis not present

## 2023-12-06 DIAGNOSIS — S80861A Insect bite (nonvenomous), right lower leg, initial encounter: Secondary | ICD-10-CM | POA: Diagnosis not present

## 2023-12-06 DIAGNOSIS — S80862A Insect bite (nonvenomous), left lower leg, initial encounter: Secondary | ICD-10-CM | POA: Diagnosis not present

## 2023-12-06 DIAGNOSIS — S90561A Insect bite (nonvenomous), right ankle, initial encounter: Secondary | ICD-10-CM | POA: Diagnosis not present

## 2024-01-06 DIAGNOSIS — S90561A Insect bite (nonvenomous), right ankle, initial encounter: Secondary | ICD-10-CM | POA: Diagnosis not present

## 2024-01-06 DIAGNOSIS — S80861A Insect bite (nonvenomous), right lower leg, initial encounter: Secondary | ICD-10-CM | POA: Diagnosis not present

## 2024-01-06 DIAGNOSIS — I788 Other diseases of capillaries: Secondary | ICD-10-CM | POA: Diagnosis not present

## 2024-01-06 DIAGNOSIS — S40862A Insect bite (nonvenomous) of left upper arm, initial encounter: Secondary | ICD-10-CM | POA: Diagnosis not present

## 2024-01-06 DIAGNOSIS — S80862A Insect bite (nonvenomous), left lower leg, initial encounter: Secondary | ICD-10-CM | POA: Diagnosis not present

## 2024-01-18 DIAGNOSIS — Z131 Encounter for screening for diabetes mellitus: Secondary | ICD-10-CM | POA: Diagnosis not present

## 2024-01-18 DIAGNOSIS — E559 Vitamin D deficiency, unspecified: Secondary | ICD-10-CM | POA: Diagnosis not present

## 2024-01-18 DIAGNOSIS — R7303 Prediabetes: Secondary | ICD-10-CM | POA: Diagnosis not present

## 2024-01-18 DIAGNOSIS — E782 Mixed hyperlipidemia: Secondary | ICD-10-CM | POA: Diagnosis not present

## 2024-01-26 DIAGNOSIS — Z131 Encounter for screening for diabetes mellitus: Secondary | ICD-10-CM | POA: Diagnosis not present

## 2024-01-26 DIAGNOSIS — D6800 Von Willebrand disease, unspecified: Secondary | ICD-10-CM | POA: Diagnosis not present

## 2024-01-26 DIAGNOSIS — G720 Drug-induced myopathy: Secondary | ICD-10-CM | POA: Diagnosis not present

## 2024-01-26 DIAGNOSIS — R809 Proteinuria, unspecified: Secondary | ICD-10-CM | POA: Diagnosis not present

## 2024-01-26 DIAGNOSIS — E559 Vitamin D deficiency, unspecified: Secondary | ICD-10-CM | POA: Diagnosis not present

## 2024-01-26 DIAGNOSIS — F17201 Nicotine dependence, unspecified, in remission: Secondary | ICD-10-CM | POA: Diagnosis not present

## 2024-01-26 DIAGNOSIS — E782 Mixed hyperlipidemia: Secondary | ICD-10-CM | POA: Diagnosis not present

## 2024-01-26 DIAGNOSIS — R945 Abnormal results of liver function studies: Secondary | ICD-10-CM | POA: Diagnosis not present

## 2024-01-26 DIAGNOSIS — R7303 Prediabetes: Secondary | ICD-10-CM | POA: Diagnosis not present

## 2024-01-26 DIAGNOSIS — R600 Localized edema: Secondary | ICD-10-CM | POA: Diagnosis not present

## 2024-03-08 DIAGNOSIS — B372 Candidiasis of skin and nail: Secondary | ICD-10-CM | POA: Diagnosis not present

## 2024-03-08 DIAGNOSIS — E782 Mixed hyperlipidemia: Secondary | ICD-10-CM | POA: Diagnosis not present

## 2024-03-08 DIAGNOSIS — Z131 Encounter for screening for diabetes mellitus: Secondary | ICD-10-CM | POA: Diagnosis not present
